# Patient Record
Sex: Female | Born: 1990 | Race: White | Hispanic: No | Marital: Married | Smoking: Never smoker
Health system: Southern US, Community
[De-identification: ages and names within clinical notes are randomized; demographics above are authoritative.]

## PROBLEM LIST (undated history)

## (undated) ENCOUNTER — Inpatient Hospital Stay: Payer: Self-pay

## (undated) DIAGNOSIS — E785 Hyperlipidemia, unspecified: Secondary | ICD-10-CM

## (undated) DIAGNOSIS — G43909 Migraine, unspecified, not intractable, without status migrainosus: Secondary | ICD-10-CM

## (undated) DIAGNOSIS — D649 Anemia, unspecified: Secondary | ICD-10-CM

## (undated) DIAGNOSIS — IMO0001 Reserved for inherently not codable concepts without codable children: Secondary | ICD-10-CM

## (undated) DIAGNOSIS — K219 Gastro-esophageal reflux disease without esophagitis: Secondary | ICD-10-CM

## (undated) HISTORY — DX: Migraine, unspecified, not intractable, without status migrainosus: G43.909

## (undated) HISTORY — DX: Anemia, unspecified: D64.9

## (undated) HISTORY — DX: Hyperlipidemia, unspecified: E78.5

---

## 2013-02-14 ENCOUNTER — Inpatient Hospital Stay: Payer: Self-pay | Admitting: Obstetrics and Gynecology

## 2013-02-15 LAB — CBC WITH DIFFERENTIAL/PLATELET
Basophil #: 0.1 10*3/uL (ref 0.0–0.1)
Basophil %: 0.7 %
Eosinophil #: 0.2 10*3/uL (ref 0.0–0.7)
Eosinophil %: 1.7 %
HCT: 33.6 % — ABNORMAL LOW (ref 35.0–47.0)
HGB: 11.3 g/dL — ABNORMAL LOW (ref 12.0–16.0)
Lymphocyte #: 2.5 10*3/uL (ref 1.0–3.6)
Lymphocyte %: 21.9 %
MCH: 28.9 pg (ref 26.0–34.0)
MCHC: 33.8 g/dL (ref 32.0–36.0)
MCV: 85 fL (ref 80–100)
Monocyte #: 0.7 x10 3/mm (ref 0.2–0.9)
Monocyte %: 6.3 %
Neutrophil #: 8.1 10*3/uL — ABNORMAL HIGH (ref 1.4–6.5)
Neutrophil %: 69.4 %
Platelet: 194 10*3/uL (ref 150–440)
RBC: 3.93 10*6/uL (ref 3.80–5.20)
RDW: 15.7 % — ABNORMAL HIGH (ref 11.5–14.5)
WBC: 11.6 10*3/uL — ABNORMAL HIGH (ref 3.6–11.0)

## 2013-02-16 LAB — HEMATOCRIT: HCT: 30.4 % — ABNORMAL LOW (ref 35.0–47.0)

## 2013-02-17 LAB — GC/CHLAMYDIA PROBE AMP

## 2013-08-09 ENCOUNTER — Emergency Department: Payer: Self-pay | Admitting: Emergency Medicine

## 2014-06-12 NOTE — Op Note (Signed)
PATIENT NAME:  Sophia Berry, Sophia Berry MR#:  [id number] DATE OF BIRTH:  15-Apr-1990  DATE OF PROCEDURE:  02/15/2013  PREOPERATIVE DIAGNOSES: 1.  A 39.0 week intrauterine pregnancy, undelivered.  2.  Fetal distress (fetal bradycardia).   POSTOPERATIVE DIAGNOSES: 1.  A 39.0 week intrauterine pregnancy, delivered.  2.  Fetal distress.  3.  Viable female 7 pounds, 4 ounces.  4.  Bandolier cord and occult cord prolapse.   OPERATIVE PROCEDURE:  Primary low cervical transverse cesarean section.   SURGEON:  Dr. Greggory KeeneFrancesco.   FIRST ASSISTANT:  None.   ANESTHESIA:  General endotracheal.   INDICATIONS:  The patient is a 24 year old white female gravida 2, para 1-0-0-1 who presented to labor and delivery in early labor.  Spontaneous two-minute deceleration was noted during the evening.  Subsequently, the patient was admitted with amniotomy and induction of labor because of the nonreassuring fetal heart rate tracing.  The patient progressed to 9 cm over 90% over 0 station with subsequent development of a fetal bradycardia in the 90s.  This persisted for greater than 15 minutes.  Cesarean section was performed in a stat manner.   FINDINGS AT SURGERY:  A viable female infant, 7 pounds 4 ounces.  There was a bandolier cord and an occult cord prolapse noted.  The infant was vigorous at birth.  Uterus, tubes and ovaries were grossly normal.   DESCRIPTION OF THE PROCEDURE:  The patient was brought to the Operating Room and in an urgent manner.  She was placed in the supine position with a right lateral hip roll in place.  She was given a Betadine and alcohol abdominal prep and drape.  Rapid sequence induction of anesthesia was performed.  Pfannenstiel incision was made in the abdomen.  The fascia was incised transversely.  Midline raphe was incised and separated and the peritoneum was entered.  Bladder flap was created with sharp dissection.  A low transverse incision was made in the uterus and this was extended in a  cephalad caudad direction.  The infant was delivered through a vertex presentation and the occult cord prolapse was identified along with a bandolier cord.  The infant was vigorous at birth.  The oropharynx was bulb-suctioned on the abdominal wall.  The infant was delivered through a standard technique.  The umbilical cord was doubly clamped and cut.  The infant was handed off to the awaiting resuscitation team.  Cord segment was obtained for arterial cord blood gas sampling.  Cord blood was also obtained.  The placenta was expressed from the uterus.  The uterus was externalized and cleared of all debris with laps.  The incision was closed in two layers with #1 chromic suture.  First layer was a running locking stitch.  Actually the second layer was not completed because of excellent hemostasis.  The uterus was placed back into the abdominopelvic cavity.  The gutters were cleared of all debris with laps.  The incision was closed in layers with 0 Maxon being used on the fascia in a continuous running manner.  The subcutaneous tissues were reapproximated using 2-0 Vicryl suture.  The skin was closed with staples.  Pressure dressing was applied.  The patient was then awakened, extubated and taken to the recovery room in satisfactory condition.   ESTIMATED BLOOD LOSS:  750 mL.   IV FLUIDS: 1 liter.   URINE OUTPUT:  Was not quantified at the time of this dictation.   Instruments, needles and sponge counts were not counted because of the stat nature  of the procedure and a KUB x-ray revealed no residual instruments or laps.  The patient did receive Ancef antibiotic prophylaxis.    ____________________________ Prentice Docker Martise Waddell, MD mad:ea D: 02/15/2013 13:12:04 ET T: 02/15/2013 23:34:52 ET JOB#: [id number]  cc: Daphine Deutscher A. Nakaya Mishkin, MD, <Dictator> [facility] Prentice Docker Daxton Nydam MD ELECTRONICALLY SIGNED 02/16/2013 13:54

## 2014-06-30 NOTE — H&P (Signed)
L&D Evaluation:  History:  HPI 22 yowf G2P1001, estimated date of confinement 02/22/2013, EGA 39.0 weeks admitted in early labor; during monitoring had a 2 minute deceleration with spontaneous resolution.   Presents with contractions   Patient's Medical History No Chronic Illness  HGSIL Pap; H/O PP Depression   Patient's Surgical History none   Medications Pre Natal Vitamins   Allergies NKDA   Social History none   Family History Non-Contributory   ROS:  ROS All systems were reviewed.  HEENT, CNS, GI, GU, Respiratory, CV, Renal and Musculoskeletal systems were found to be normal.   Exam:  Vital Signs stable   Urine Protein not completed   General no apparent distress   Mental Status clear   Heart normal sinus rhythm   Abdomen gravid, non-tender   Estimated Fetal Weight Average for gestational age   Back no CVAT   Edema 1+   Pelvic no external lesions, 4/0/-2/Vtx/bowi   FHT normal rate with no decels, 1 EPISODE OF 2 MINUTE DECELERATION, RESOLVED   Ucx irregular   Skin dry   Lymph no lymphadenopathy   Other O+/ATB-/NR/Rubella NONIMMUNE/VI/HB-/HIV-Glucola 114/GBS-/GC-/CT-   Impression:  Impression early labor, TIUP in early labor; nonreassuring FHR tracing with 2 minute spontaneous deceleration.   Plan:  Plan monitor contractions and for cervical change, fluids, AROM/Internal Monitoring/Pitocin Augmentation   Electronic Signatures: Alexxander Kurt, Prentice DockerMartin A (MD)  (Signed 27-Dec-14 09:42)  Authored: L&D Evaluation   Last Updated: 27-Dec-14 09:42 by Manfred Laspina, Prentice DockerMartin A (MD)

## 2014-06-30 NOTE — H&P (Signed)
L&D Evaluation:  History:  HPI TIUP. Check in office today 2-3 cm.   Presents with abdominal pain, back pain   Exam:  Vital Signs stable   General uncomfortable   Mental Status clear   Estimated Fetal Weight Average for gestational age   Pelvic 2-3 cm (RN Exam)   Mebranes Intact   FHT normal rate with no decels, Mild undulation initially when patient was on back supine, resolved.   Ucx irregular   Impression:  Impression early labor, NST Reactive   Plan:  Plan EFM/NST, monitor contractions and for cervical change   Electronic Signatures: DeFrancesco, Prentice DockerMartin A (MD)  (Signed 26-Dec-14 23:02)  Authored: L&D Evaluation   Last Updated: 26-Dec-14 23:02 by DeFrancesco, Prentice DockerMartin A (MD)

## 2015-01-05 DIAGNOSIS — Z3492 Encounter for supervision of normal pregnancy, unspecified, second trimester: Secondary | ICD-10-CM | POA: Insufficient documentation

## 2015-01-05 DIAGNOSIS — E669 Obesity, unspecified: Secondary | ICD-10-CM | POA: Insufficient documentation

## 2015-01-05 LAB — OB RESULTS CONSOLE RPR: RPR: NONREACTIVE

## 2015-01-05 LAB — OB RESULTS CONSOLE HIV ANTIBODY (ROUTINE TESTING): HIV: NONREACTIVE

## 2015-01-05 LAB — OB RESULTS CONSOLE HEPATITIS B SURFACE ANTIGEN: Hepatitis B Surface Ag: NEGATIVE

## 2015-01-05 LAB — OB RESULTS CONSOLE VARICELLA ZOSTER ANTIBODY, IGG: Varicella: IMMUNE

## 2015-01-05 LAB — OB RESULTS CONSOLE RUBELLA ANTIBODY, IGM: Rubella: IMMUNE

## 2015-01-05 LAB — OB RESULTS CONSOLE GC/CHLAMYDIA
Chlamydia: NEGATIVE
Gonorrhea: NEGATIVE

## 2015-02-02 ENCOUNTER — Ambulatory Visit (INDEPENDENT_AMBULATORY_CARE_PROVIDER_SITE_OTHER): Payer: Medicaid Other | Admitting: Certified Nurse Midwife

## 2015-02-02 ENCOUNTER — Encounter: Payer: Self-pay | Admitting: Certified Nurse Midwife

## 2015-02-02 VITALS — BP 92/60 | HR 76 | Ht 65.0 in | Wt 229.6 lb

## 2015-02-02 DIAGNOSIS — Z331 Pregnant state, incidental: Secondary | ICD-10-CM | POA: Diagnosis not present

## 2015-02-02 DIAGNOSIS — Z1389 Encounter for screening for other disorder: Secondary | ICD-10-CM

## 2015-02-02 DIAGNOSIS — Z3490 Encounter for supervision of normal pregnancy, unspecified, unspecified trimester: Secondary | ICD-10-CM

## 2015-02-02 DIAGNOSIS — Z369 Encounter for antenatal screening, unspecified: Secondary | ICD-10-CM

## 2015-02-02 DIAGNOSIS — O34219 Maternal care for unspecified type scar from previous cesarean delivery: Secondary | ICD-10-CM | POA: Insufficient documentation

## 2015-02-02 DIAGNOSIS — Z36 Encounter for antenatal screening of mother: Secondary | ICD-10-CM

## 2015-02-02 LAB — POCT URINALYSIS DIPSTICK
Bilirubin, UA: NEGATIVE
Blood, UA: NEGATIVE
Glucose, UA: NEGATIVE
Ketones, UA: NEGATIVE
Nitrite, UA: NEGATIVE
Protein, UA: NEGATIVE
Spec Grav, UA: 1.02
Urobilinogen, UA: NEGATIVE
pH, UA: 7

## 2015-02-02 NOTE — Patient Instructions (Signed)

## 2015-02-02 NOTE — Progress Notes (Signed)
[facility] No records are available. Have called for records. [facility] 01/11/2015. Does not take prenatal vitamins because they make her sick.

## 2015-02-02 NOTE — Progress Notes (Signed)
NEW OB HISTORY AND PHYSICAL  SUBJECTIVE:       Sophia Berry is a 24 y.o. 533P2002 female, Patient's last menstrual period was 08/30/2014 (exact date)., Estimated Date of Delivery: 06/06/15, 7033w2d, presents today for establishment of Prenatal Care. She has no unusual complaints.  [facility]  Records not availabe but have been requested.  Gynecologic History Patient's last menstrual period was 08/30/2014 (exact date). Normal Contraception: none   Obstetric History OB History  Gravida Para Term Preterm AB SAB TAB Ectopic Multiple Living  3 2 2       2     # Outcome Date GA Lbr Len/2nd Weight Sex Delivery Anes PTL Lv  3 Current           2 Term 02/15/13 5159w0d  7 lb 14 oz (3.572 kg) F CS-Unspec  N Y  1 Term 05/02/08 4777w0d  6 lb 14 oz (3.118 kg) F Vag-Spont   Y    Obstetric Comments  Fetal distress.    Past Medical History  Diagnosis Date  . Hyperlipidemia   . Anemia   . Migraines     Past Surgical History  Procedure Laterality Date  . Cesarean section      No current outpatient prescriptions on file prior to visit.   No current facility-administered medications on file prior to visit.    No Known Allergies  Social History   Social History  . Marital Status: Married    Spouse Name: N/A  . Number of Children: N/A  . Years of Education: N/A   Occupational History  . homemaker    Social History Main Topics  . Smoking status: Never Smoker   . Smokeless tobacco: Never Used  . Alcohol Use: No  . Drug Use: No  . Sexual Activity:    Partners: Male   Other Topics Concern  . Not on file   Social History Narrative    Family History  Problem Relation Age of Onset  . Asthma Mother   . Breast cancer Maternal Aunt     Great  . Cancer Maternal Grandmother     mat. great grandmother/unsure of what kind  . Diabetes Other     mat great grandmother  . Heart failure Other     mat great grandmother  . Migraines Mother     The following portions  of the patient's history were reviewed and updated as appropriate: allergies, current medications, past OB history, past medical history, past surgical history, past family history, past social history, and problem list.    OBJECTIVE: Initial Physical Exam (New OB)  GENERAL APPEARANCE: alert, well appearing, in no apparent distress, oriented to person, place and time, overweight HEAD: normocephalic, atraumatic MOUTH: mucous membranes moist, pharynx normal without lesions and dental hygiene good THYROID: no thyromegaly or masses present BREASTS: no masses noted, no significant tenderness, no palpable axillary nodes, no skin changes, not examined LUNGS: clear to auscultation, no wheezes, rales or rhonchi, symmetric air entry HEART: regular rate and rhythm, no murmurs ABDOMEN: soft, nontender, nondistended, no abnormal masses, no epigastric pain EXTREMITIES: no redness or tenderness in the calves or thighs, no edema SKIN: normal coloration and turgor, no rashes LYMPH NODES: no adenopathy palpable NEUROLOGIC: alert, oriented, normal speech, no focal findings or movement disorder noted, DTRs normal and symmetrical  PELVIC EXAM not examined, completed by previous provider.  Will complete at 36 weeks  ASSESSMENT: Normal pregnancy History of cesarean plans repeat at 39 weeks Obesity  PLAN:  Prenatal care Records release for [facility] at [facility] GTT next visit & H & H Dr Valentino Saxon appointment for ceserean scheduling & BTL

## 2015-02-21 NOTE — L&D Delivery Note (Signed)
Delivery Summary for Sophia Berry  Labor Events:   Preterm labor:   Rupture date:   Rupture time:   Rupture type:   Fluid Color: Clear  Induction:   Augmentation:   Complications:   Cervical ripening:          Delivery:   Episiotomy:   Lacerations:   Repair suture:   Repair # of packets:   Blood loss (ml):    Information for the patient's newborn:  Clare Gandyhibodaux, Girl Romie MinusDevan [[id number]][[id number]]    Delivery 05/31/2015 11:13 AM by  C-Section, Low Vertical Sex:  female Gestational Age: 4742w1d Delivery Clinician:  Hildred LaserAnika Aaralyn Kil Living?: Yes        APGARS  One minute Five minutes Ten minutes  Skin color: 0   1      Heart rate: 2   2      Grimace: 2   2      Muscle tone: 2   2      Breathing: 2   2      Totals: 8  9      Presentation/position:      Resuscitation:   Cord information: 3 vessels   Disposition of cord blood:     Blood gases sent?  Complications:   Placenta: Delivered: 05/31/2015 11:14 AM  Manual removal  Intact appearance Newborn Measurements: Weight: 8 lb 7.8 oz (3850 g)  Height: 20.08"  Head circumference:    Chest circumference:    Other providers: Midwife Registered Nurse Transition RN Neonatologist Melody N Shambley Kandice RobinsonsKelly A Yates Carolyn E Wanek John E Wimmer  Additional  information: Forceps:   Vacuum:   Breech:   Observed anomalies        See Operative Note by Dr. Hildred LaserAnika Quinntin Malter for details of C-section procedure.    Hildred LaserAnika Apryll Hinkle, MD [facility]

## 2015-03-02 ENCOUNTER — Encounter: Payer: Medicaid Other | Admitting: Certified Nurse Midwife

## 2015-03-05 ENCOUNTER — Ambulatory Visit (INDEPENDENT_AMBULATORY_CARE_PROVIDER_SITE_OTHER): Payer: Medicaid Other | Admitting: Obstetrics and Gynecology

## 2015-03-05 VITALS — BP 103/64 | HR 90 | Wt 232.0 lb

## 2015-03-05 DIAGNOSIS — Z131 Encounter for screening for diabetes mellitus: Secondary | ICD-10-CM

## 2015-03-05 DIAGNOSIS — O34219 Maternal care for unspecified type scar from previous cesarean delivery: Secondary | ICD-10-CM

## 2015-03-05 DIAGNOSIS — Z3482 Encounter for supervision of other normal pregnancy, second trimester: Secondary | ICD-10-CM

## 2015-03-05 DIAGNOSIS — Z3492 Encounter for supervision of normal pregnancy, unspecified, second trimester: Secondary | ICD-10-CM

## 2015-03-05 DIAGNOSIS — E669 Obesity, unspecified: Secondary | ICD-10-CM

## 2015-03-05 DIAGNOSIS — Z308 Encounter for other contraceptive management: Secondary | ICD-10-CM

## 2015-03-05 LAB — POCT URINALYSIS DIPSTICK
Bilirubin, UA: NEGATIVE
Blood, UA: NEGATIVE
Glucose, UA: NEGATIVE
Ketones, UA: NEGATIVE
Nitrite, UA: NEGATIVE
Protein, UA: NEGATIVE
Spec Grav, UA: 1.015
Urobilinogen, UA: NEGATIVE
pH, UA: 6.5

## 2015-03-05 NOTE — Patient Instructions (Signed)
Trial of Labor After Cesarean Delivery Information A trial of labor after cesarean delivery (TOLAC) is when a woman tries to give birth vaginally after a previous cesarean delivery. TOLAC may be a safe and appropriate option for you depending on your medical history and other risk factors. When TOLAC is successful and you are able to have a vaginal delivery, this is called a vaginal birth after cesarean delivery (VBAC).  CANDIDATES FOR TOLAC TOLAC is possible for some women who:  Have undergone one or two prior cesarean deliveries in which the incision of the uterus was horizontal (low transverse).  Are carrying twins and have had one prior low transverse incision during a cesarean delivery.  Do not have a vertical (classical) uterine scar.  Have not had a tear in the wall of their uterus (uterine rupture). TOLAC is also supported for women who meet appropriate criteria and:  Are under the age of 40 years.  Are tall and have a body mass index (BMI) of less than 30.  Have an unknown uterine scar.  Give birth in a facility equipped to handle an emergency cesarean delivery. This team should be able to handle possible complications such as a uterine rupture.  Have thorough counseling about the benefits and risks of TOLAC.  Have discussed future pregnancy plans with their health care provider.  Plan to have several more pregnancies. MOST SUCCESSFUL CANDIDATES FOR TOLAC:  Have had a successful vaginal delivery before or after their cesarean delivery.  Experience labor that begins naturally on or before the due date (40 weeks of gestation).  Do not have a very large (macrosomic) baby.   Had a prior cesarean delivery but are not currently experiencing factors that would prompt a cesarean delivery (such as a breech position).  Had only one prior cesarean delivery.  Had a prior cesarean delivery that was performed early in labor and not after full cervical dilation. TOLAC may be most  appropriate for women who meet the above guidelines and who plan to have more pregnancies. TOLAC is not recommended for home births. LEAST SUCCESSFUL CANDIDATES FOR TOLAC:  Have an induced labor with an unfavorable cervix. An unfavorable cervix is when the cervix is not dilating enough (among other factors).  Have never had a vaginal delivery.  Have had more than two cesarean deliveries.  Have a pregnancy at more than 40 weeks of gestation.  Are pregnant with a baby with a suspected weight greater than 4,000 grams (8 pounds) and who have no prior history of a vaginal delivery.  Have closely spaced pregnancies. SUGGESTED BENEFITS OF TOLAC  You may have a faster recovery time.  You may have a shorter stay in the hospital.  You may have less pain and fewer problems than with a cesarean delivery. Women who have a cesarean delivery have a higher chance of needing blood or getting a fever, an infection, or a blood clot in the legs. SUGGESTED RISKS OF TOLAC The highest risk of complications happens to women who attempt a TOLAC and fail. A failed TOLAC results in an unplanned cesarean delivery. Risks related to TOLAC or repeat cesarean deliveries include:   Blood loss.  Infection.  Blood clot.  Injury to surrounding tissues or organs.  Having to remove the uterus (hysterectomy).  Potential problems with the placenta (such as placenta previa or placenta accreta) in future pregnancies. Although very rare, the main concerns with TOLAC are:  Rupture of the uterine scar from a past cesarean delivery.  Needing an   emergency cesarean delivery.  Having a bad outcome for the baby (perinatal morbidity). FOR MORE INFORMATION American Congress of Obstetricians and Gynecologists: www.acog.org American College of Nurse-Midwives: www.midwife.org   This information is not intended to replace advice given to you by your health care provider. Make sure you discuss any questions you have with  your health care provider.   Document Released: 10/25/2010 Document Revised: 11/27/2012 Document Reviewed: 07/29/2012 Elsevier Interactive Patient Education 2016 Elsevier Inc. Cesarean Delivery Cesarean delivery is the birth of a baby through a cut (incision) in the abdomen and womb (uterus).  LET YOUR HEALTH CARE PROVIDER KNOW ABOUT:  All medicines you are taking, including vitamins, herbs, eye drops, creams, and over-the-counter medicines.  Previous problems you or members of your family have had with the use of anesthetics.  Any bleeding or blood clotting disorders you have.  Family history of blood clots or bleeding disorders.  Any history of deep vein thrombosis (DVT) or pulmonary embolism (PE).  Previous surgeries you have had.  Medical conditions you have.  Any allergies you have.  Complicationsinvolving the pregnancy. RISKS AND COMPLICATIONS  Generally, this is a safe procedure. However, as with any procedure, complications can occur. Possible complications include:  Bleeding.  Infection.  Blood clots.  Injury to surrounding organs.  Problems with anesthesia.  Injury to the baby. BEFORE THE PROCEDURE   You may be given an antacid medicine to drink. This will prevent acid contents in your stomach from going into your lungs if you vomit during the surgery.  You may be given an antibiotic medicine to prevent infection. PROCEDURE   To prevent infection of your incision:  Hair may be removed from your pubic area if it is near your incision.  The skin of your pubic area and lower abdomen will be cleaned with a germ-killing solution (antiseptic).  A tube (Foley catheter) will be placed in your bladder to drain your urine from your bladder into a bag. This keeps your bladder empty during surgery.  An IV tube will be placed in your vein.  You may be given medicine to numb the lower half of your body (regional anesthetic). If you were in labor, you may have  already had an epidural in place which can be used in both labor and cesarean delivery. You may possibly be given medicine to make you sleep (general anesthetic) though this is not as common.  Your heart rate and your baby's heart rate will be monitored.  An incision will be made in your abdomen that extends to your uterus. There are 2 basic kinds of incisions:  The horizontal (transverse) incision. Horizontal incisions are from side to side and are used for most routine cesarean deliveries.  The vertical incision. The vertical incision is from the top of the abdomen to the bottom and is less commonly used. It is often done for women who have a serious complication (extreme prematurity) or under emergency situations.  The horizontal and vertical incisions may both be used at the same time. However, this is very uncommon.  An incision is then made in your uterus to deliver the baby.  Your baby will be delivered.  Your health care provider may place the baby on your chest. It is important to keep the baby warm. Your health care provider will dry off the baby, place the baby directly on your bare skin, and cover the baby with warm, dry blankets.  Both incisions will be closed with absorbable stitches. AFTER THE PROCEDURE     incisions will be closed with absorbable stitches. AFTER THE PROCEDURE   If you were awake during the surgery, you will see your baby right away. If you were asleep, you will see your baby as soon as you are awake.  You may breastfeed your baby after surgery.  You may be able to get up and walk the same day as the surgery. If you need to stay in bed for a period of time, you will receive help to turn, cough, and take deep breaths after surgery. This helps prevent lung problems such as pneumonia.  Do not get out of bed alone the first time after surgery. You will need help getting out of bed until you are able to do this by yourself.  You may be able to shower the day after your cesarean delivery. After the bandage  (dressing) is taken off the incision site, a nurse will assist you to shower if you would like help.  You may be directed to take actions to help prevent blood clots in your legs. These may include:  Walking shortly after surgery, with someone assisting you. Moving around after surgery helps to improve blood flow.  Wearing compression stockings or using different types of devices.  Taking medicines to thin your blood (anticoagulants) if you are at high risk for DVT or PE.  Save any blood clots that you pass from your vagina. If you pass a clot while on the toilet, do not flush it. Call for the nurse. Tell the nurse if you think you are bleeding too much or passing too many clots.  You will be given medicine for pain and nausea as needed. Let your health care providers know if you are hurting. You may also be given an antibiotic to prevent an infection.  Your IV tube will be taken out when you are drinking a reasonable amount of fluids. The Foley catheter is taken out when you are up and walking.  If your blood type is Rh negative and your baby's blood type is Rh positive, you will be given a shot of anti-D immune globulin. This shot prevents you from having Rh problems with a future pregnancy. You should get the shot even if you had your tubes tied (tubal ligation).  If you are allowed to take the baby for a walk, place the baby in the bassinet and push it.   This information is not intended to replace advice given to you by your health care provider. Make sure you discuss any questions you have with your health care provider.   Document Released: 02/06/2005 Document Revised: 10/28/2014 Document Reviewed: 10/04/2011 Elsevier Interactive Patient Education [employer]2016 Elsevier Inc.

## 2015-03-05 NOTE — Progress Notes (Signed)
ROB: Patient doing well, no complaints. For glucola today.  Signed blood consent and BTL papers after contraception counseling.  Also discussed VBAC vs repeat C-section (patient was under impression that she needed a C-section to have BTL). Patient notes that she will think about it.  To discuss at further visits.  To f/u with MD at 38 weeks for pre-op if C-section still desired. For Tdap next visit.  RTC in 2 weeks. [facility]

## 2015-03-06 LAB — GLUCOSE TOLERANCE, 1 HOUR: Glucose, 1Hr PP: 143 mg/dL (ref 65–199)

## 2015-03-06 LAB — HEMOGLOBIN AND HEMATOCRIT, BLOOD
Hematocrit: 30.1 % — ABNORMAL LOW (ref 34.0–46.6)
Hemoglobin: 10.1 g/dL — ABNORMAL LOW (ref 11.1–15.9)

## 2015-03-18 ENCOUNTER — Ambulatory Visit (INDEPENDENT_AMBULATORY_CARE_PROVIDER_SITE_OTHER): Payer: Medicaid Other | Admitting: Obstetrics and Gynecology

## 2015-03-18 ENCOUNTER — Encounter: Payer: Self-pay | Admitting: Obstetrics and Gynecology

## 2015-03-18 VITALS — BP 108/72 | HR 112 | Wt 230.2 lb

## 2015-03-18 DIAGNOSIS — Z23 Encounter for immunization: Secondary | ICD-10-CM

## 2015-03-18 DIAGNOSIS — Z331 Pregnant state, incidental: Secondary | ICD-10-CM

## 2015-03-18 DIAGNOSIS — Z131 Encounter for screening for diabetes mellitus: Secondary | ICD-10-CM

## 2015-03-18 LAB — POCT URINALYSIS DIPSTICK
Bilirubin, UA: NEGATIVE
Blood, UA: NEGATIVE
Glucose, UA: NEGATIVE
Ketones, UA: NEGATIVE
Leukocytes, UA: NEGATIVE
Nitrite, UA: NEGATIVE
Protein, UA: NEGATIVE
Spec Grav, UA: 1.015
Urobilinogen, UA: 0.2
pH, UA: 6.5

## 2015-03-18 MED ORDER — TETANUS-DIPHTH-ACELL PERTUSSIS 5-2.5-18.5 LF-MCG/0.5 IM SUSP
0.5000 mL | Freq: Once | INTRAMUSCULAR | Status: AC
  Administered 2015-03-18: 0.5 mL via INTRAMUSCULAR

## 2015-03-18 NOTE — Progress Notes (Signed)
ROB- 3 hr GTT done today, pt is having some pelvic pressure and some back spasms

## 2015-03-18 NOTE — Patient Instructions (Signed)

## 2015-03-18 NOTE — Progress Notes (Signed)
ROB and 3hGTT- doing well,normal aches of pregnancy, decided she wants to proceed with repeat c/s and BTL- will schedule with Dr Valentino Saxon at 36 weeks for pre-op. Fusion Plus samples given,

## 2015-03-19 LAB — GESTATIONAL GLUCOSE TOLERANCE
Glucose, Fasting: 83 mg/dL (ref 65–94)
Glucose, GTT - 1 Hour: 179 mg/dL (ref 65–179)
Glucose, GTT - 2 Hour: 117 mg/dL (ref 65–154)
Glucose, GTT - 3 Hour: 82 mg/dL (ref 65–139)

## 2015-03-19 LAB — PLEASE NOTE

## 2015-03-23 ENCOUNTER — Observation Stay
Admission: EM | Admit: 2015-03-23 | Discharge: 2015-03-23 | Disposition: A | Discharge status: Home / Self Care | Payer: Medicaid Other | Attending: Obstetrics and Gynecology | Admitting: Obstetrics and Gynecology

## 2015-03-23 ENCOUNTER — Encounter: Payer: Self-pay | Admitting: *Deleted

## 2015-03-23 DIAGNOSIS — O368130 Decreased fetal movements, third trimester, not applicable or unspecified: Principal | ICD-10-CM | POA: Insufficient documentation

## 2015-03-23 DIAGNOSIS — Z3A29 29 weeks gestation of pregnancy: Secondary | ICD-10-CM | POA: Insufficient documentation

## 2015-03-23 DIAGNOSIS — B373 Candidiasis of vulva and vagina: Secondary | ICD-10-CM

## 2015-03-23 DIAGNOSIS — O34219 Maternal care for unspecified type scar from previous cesarean delivery: Secondary | ICD-10-CM

## 2015-03-23 DIAGNOSIS — B3731 Acute candidiasis of vulva and vagina: Secondary | ICD-10-CM

## 2015-03-23 LAB — URINALYSIS COMPLETE WITH MICROSCOPIC (ARMC ONLY)
Bilirubin Urine: NEGATIVE
Glucose, UA: NEGATIVE mg/dL
Hgb urine dipstick: NEGATIVE
Ketones, ur: NEGATIVE mg/dL
Leukocytes, UA: NEGATIVE
Nitrite: NEGATIVE
Protein, ur: NEGATIVE mg/dL
Specific Gravity, Urine: 1.027 (ref 1.005–1.030)
WBC, UA: NONE SEEN WBC/hpf (ref 0–5)
pH: 6 (ref 5.0–8.0)

## 2015-03-23 MED ORDER — FLUCONAZOLE 50 MG PO TABS
150.0000 mg | ORAL_TABLET | Freq: Once | ORAL | Status: AC
  Administered 2015-03-23: 150 mg via ORAL
  Filled 2015-03-23: qty 2
  Filled 2015-03-23: qty 3
  Filled 2015-03-23: qty 1

## 2015-03-23 NOTE — Discharge Instructions (Signed)
Discharge instructions reviewed with patient, patient verbalized understanding, signed copy, copy given.

## 2015-03-23 NOTE — OB Triage Provider Note (Signed)
L&D OB Triage Note  Sophia Berry is a 25 y.o. G3P2002 female at [redacted]w[redacted]d, EDD Estimated Date of Delivery: 06/06/15 who presented to triage for complaints of lower back/pelvic pains x 24 hours-worse this pm, increased watery discharge, and decreased fetal mov't. Denies intercourse in last couple of months.  She was evaluated by the nurses with no findings significant for preterm contractions, but was found to be NTZ+ after an SVE. I examined her with sterile speculum and found thin white d/c in vaina- negative NTZ, negative pooling, negative Fern, + yeast on wetprep, and cervix is clossed and thick.. Vital signs stable. An NST was performed and has been reviewed by Myself. She was treated with 1 dose diflucan  po.   NST INTERPRETATION: Indications: decreased fetal movement and rule out uterine contractions  Mode: External Baseline Rate (A): 130 bpm Variability: Moderate Accelerations: 15 x 15 Decelerations: None     Contraction Frequency (min): none  Impression: reactive   Plan: NST performed was reviewed and was found to be reactive. She was discharged home with bleeding/labor precautions.  Continue routine prenatal care. Follow up with OB/GYN as previously scheduled.     Melody Suzan Nailer, CNM

## 2015-03-23 NOTE — OB Triage Note (Signed)
Pt here for lower abd cramping and lower back pain, nauseous, vaginal leaking, no foul odor, no vaginal bleeding, a lot of vaginal pressure. This started last night around 2000, rested and the pain started again this a.m.

## 2015-03-23 NOTE — Progress Notes (Signed)
Nitrazine test positive. CNM notified, will be in to evaluate patient. Reactive NST, with a baseline of 130s, no contractions noted. Abd. Palpate soft.

## 2015-03-24 DIAGNOSIS — O368130 Decreased fetal movements, third trimester, not applicable or unspecified: Secondary | ICD-10-CM | POA: Diagnosis not present

## 2015-03-25 LAB — URINE CULTURE: Special Requests: NORMAL

## 2015-04-02 ENCOUNTER — Encounter: Payer: Self-pay | Admitting: Obstetrics and Gynecology

## 2015-04-02 ENCOUNTER — Ambulatory Visit (INDEPENDENT_AMBULATORY_CARE_PROVIDER_SITE_OTHER): Payer: Medicaid Other | Admitting: Obstetrics and Gynecology

## 2015-04-02 VITALS — BP 106/57 | HR 101 | Wt 230.7 lb

## 2015-04-02 DIAGNOSIS — Z331 Pregnant state, incidental: Secondary | ICD-10-CM

## 2015-04-02 LAB — POCT URINALYSIS DIPSTICK
Blood, UA: NEGATIVE
Glucose, UA: NEGATIVE
Ketones, UA: NEGATIVE
Leukocytes, UA: NEGATIVE
Nitrite, UA: NEGATIVE
Spec Grav, UA: 1.02
Urobilinogen, UA: 0.2
pH, UA: 6.5

## 2015-04-02 NOTE — Progress Notes (Signed)
ROB- reassured of no cervical change, needs to increased fluid intake.

## 2015-04-02 NOTE — Progress Notes (Signed)
ROB- pt is c/o low back pain, has been throwing up and diarrhea this week, not sure if she has had the stomach virus

## 2015-04-16 ENCOUNTER — Ambulatory Visit (INDEPENDENT_AMBULATORY_CARE_PROVIDER_SITE_OTHER): Payer: Medicaid Other | Admitting: Obstetrics and Gynecology

## 2015-04-16 ENCOUNTER — Encounter: Payer: Self-pay | Admitting: Obstetrics and Gynecology

## 2015-04-16 VITALS — BP 106/66 | HR 96 | Wt 233.7 lb

## 2015-04-16 DIAGNOSIS — Z3493 Encounter for supervision of normal pregnancy, unspecified, third trimester: Secondary | ICD-10-CM

## 2015-04-16 LAB — POCT URINALYSIS DIPSTICK
Bilirubin, UA: NEGATIVE
Blood, UA: NEGATIVE
Glucose, UA: NEGATIVE
Ketones, UA: NEGATIVE
Leukocytes, UA: NEGATIVE
Nitrite, UA: NEGATIVE
Protein, UA: NEGATIVE
Spec Grav, UA: 1.015
Urobilinogen, UA: 0.2
pH, UA: 7

## 2015-04-16 NOTE — Progress Notes (Signed)
ROB- doing well, 

## 2015-04-16 NOTE — Progress Notes (Signed)
No complaints

## 2015-04-18 ENCOUNTER — Emergency Department: Payer: Medicaid Other

## 2015-04-18 ENCOUNTER — Emergency Department
Admission: EM | Admit: 2015-04-18 | Discharge: 2015-04-18 | Disposition: A | Discharge status: Home / Self Care | Payer: Medicaid Other | Attending: Emergency Medicine | Admitting: Emergency Medicine

## 2015-04-18 DIAGNOSIS — R000 Tachycardia, unspecified: Secondary | ICD-10-CM | POA: Insufficient documentation

## 2015-04-18 DIAGNOSIS — Z79899 Other long term (current) drug therapy: Secondary | ICD-10-CM | POA: Insufficient documentation

## 2015-04-18 DIAGNOSIS — O99513 Diseases of the respiratory system complicating pregnancy, third trimester: Secondary | ICD-10-CM | POA: Insufficient documentation

## 2015-04-18 DIAGNOSIS — Z3A31 31 weeks gestation of pregnancy: Secondary | ICD-10-CM | POA: Insufficient documentation

## 2015-04-18 DIAGNOSIS — J101 Influenza due to other identified influenza virus with other respiratory manifestations: Secondary | ICD-10-CM | POA: Diagnosis not present

## 2015-04-18 DIAGNOSIS — O9989 Other specified diseases and conditions complicating pregnancy, childbirth and the puerperium: Secondary | ICD-10-CM | POA: Diagnosis not present

## 2015-04-18 LAB — COMPREHENSIVE METABOLIC PANEL
ALT: 18 U/L (ref 14–54)
AST: 19 U/L (ref 15–41)
Albumin: 3.1 g/dL — ABNORMAL LOW (ref 3.5–5.0)
Alkaline Phosphatase: 86 U/L (ref 38–126)
Anion gap: 8 (ref 5–15)
BUN: 7 mg/dL (ref 6–20)
CO2: 21 mmol/L — ABNORMAL LOW (ref 22–32)
Calcium: 8.6 mg/dL — ABNORMAL LOW (ref 8.9–10.3)
Chloride: 106 mmol/L (ref 101–111)
Creatinine, Ser: 0.52 mg/dL (ref 0.44–1.00)
GFR calc Af Amer: >60 mL/min (ref >60)
GFR calc non Af Amer: >60 mL/min (ref >60)
Glucose, Bld: 91 mg/dL (ref 65–99)
Potassium: 3.6 mmol/L (ref 3.5–5.1)
Sodium: 135 mmol/L (ref 135–145)
Total Bilirubin: 0.9 mg/dL (ref 0.3–1.2)
Total Protein: 6.3 g/dL — ABNORMAL LOW (ref 6.5–8.1)

## 2015-04-18 LAB — URINALYSIS COMPLETE WITH MICROSCOPIC (ARMC ONLY)
Bilirubin Urine: NEGATIVE
Glucose, UA: NEGATIVE mg/dL
Hgb urine dipstick: NEGATIVE
Leukocytes, UA: NEGATIVE
Nitrite: NEGATIVE
Protein, ur: NEGATIVE mg/dL
RBC / HPF: NONE SEEN RBC/hpf (ref 0–5)
Specific Gravity, Urine: 1.016 (ref 1.005–1.030)
WBC, UA: NONE SEEN WBC/hpf (ref 0–5)
pH: 6 (ref 5.0–8.0)

## 2015-04-18 LAB — CBC
HCT: 27.2 % — ABNORMAL LOW (ref 35.0–47.0)
Hemoglobin: 9.5 g/dL — ABNORMAL LOW (ref 12.0–16.0)
MCH: 29.3 pg (ref 26.0–34.0)
MCHC: 35.1 g/dL (ref 32.0–36.0)
MCV: 83.6 fL (ref 80.0–100.0)
Platelets: 182 10*3/uL (ref 150–440)
RBC: 3.25 MIL/uL — ABNORMAL LOW (ref 3.80–5.20)
RDW: 15.9 % — ABNORMAL HIGH (ref 11.5–14.5)
WBC: 10.3 10*3/uL (ref 3.6–11.0)

## 2015-04-18 LAB — RAPID INFLUENZA A&B ANTIGENS
Influenza A (ARMC): DETECTED
Influenza B (ARMC): NOT DETECTED

## 2015-04-18 MED ORDER — OSELTAMIVIR PHOSPHATE 75 MG PO CAPS: 75.0000 mg | ORAL_CAPSULE | Freq: Two times a day (BID) | ORAL | Status: AC

## 2015-04-18 MED ORDER — ACETAMINOPHEN 325 MG PO TABS
650.0000 mg | ORAL_TABLET | Freq: Once | ORAL | Status: AC
  Administered 2015-04-18: 650 mg via ORAL
  Filled 2015-04-18: qty 2

## 2015-04-18 MED ORDER — SODIUM CHLORIDE 0.9 % IV SOLN
1000.0000 mL | Freq: Once | INTRAVENOUS | Status: AC
  Administered 2015-04-18: 1000 mL via INTRAVENOUS

## 2015-04-18 NOTE — ED Notes (Addendum)
Pt reports waking up with a cough on Friday unknown fever reports chills and sweats. States pain all over. Pt also [redacted] weeks pregnant. No vomiting no diarrhea.

## 2015-04-18 NOTE — Discharge Instructions (Signed)

## 2015-04-18 NOTE — ED Provider Notes (Signed)
[facility] Emergency Department Provider Note  ____________________________________________    I have reviewed the triage vital signs and the nursing notes.   HISTORY  Chief Complaint Cough   HPI Sophia Berry is a 25 y.o. female who presents with complaints of severe cough, myalgias and mild shortness of breath for 2 days. Husband reportedly had similar symptoms as does daughter. Patient is [redacted] weeks pregnant. She denies recent travel. No calf pain or swelling. She reports she recently recovered from a viral GI bug and then developed this cough 2 days ago. She complains of mild short of breath when ambulating. He is here with her daughter who is also being seen     Past Medical History  Diagnosis Date  . Hyperlipidemia   . Anemia   . Migraines     Patient Active Problem List   Diagnosis Date Noted  . Labor and delivery, indication for care 03/23/2015  . Yeast infection of the vagina 03/23/2015  . Previous cesarean delivery, antepartum 02/02/2015  . Adiposity 01/05/2015  . Encounter for supervision of normal pregnancy in second trimester 01/05/2015    Past Surgical History  Procedure Laterality Date  . Cesarean section      Current Outpatient Rx  Name  Route  Sig  Dispense  Refill  . ferrous sulfate 325 (65 FE) MG tablet   Oral   Take 325 mg by mouth daily with breakfast.         . Prenatal Vit-Fe Fumarate-FA (PRENATAL MULTIVITAMIN) TABS tablet   Oral   Take 1 tablet by mouth daily at 12 noon. Reported on 03/23/2015           Allergies Review of patient's allergies indicates no known allergies.  Family History  Problem Relation Age of Onset  . Asthma Mother   . Breast cancer Maternal Aunt     Great  . Cancer Maternal Grandmother     mat. great grandmother/unsure of what kind  . Diabetes Other     mat great grandmother  . Heart failure Other     mat great grandmother  . Migraines Mother     Social History Social  History  Substance Use Topics  . Smoking status: Never Smoker   . Smokeless tobacco: Never Used  . Alcohol Use: No    Review of Systems  Constitutional: Subjective fevers Eyes: Negative for visual changes. ENT: Mild sore throat Cardiovascular: Negative for chest pain. Respiratory: Positive for mild short of breath and cough Gastrointestinal: Negative for abdominal pain, vomiting and diarrhea. Genitourinary: Negative for dysuria. Musculoskeletal: Negative for back pain. Skin: Negative for rash. Neurological: Negative for headaches or neck pain Psychiatric: No anxiety    ____________________________________________   PHYSICAL EXAM:  VITAL SIGNS: ED Triage Vitals  Enc Vitals Group     BP 04/18/15 0900 104/53 mmHg     Pulse Rate 04/18/15 0900 128     Resp 04/18/15 0900 20     Temp 04/18/15 0900 98.9 F (37.2 C)     Temp Source 04/18/15 0900 Oral     SpO2 04/18/15 0900 96 %     Weight 04/18/15 0900 233 lb (105.688 kg)     Height 04/18/15 0900  (1.651 m)     Head Cir --      Peak Flow --      Pain Score 04/18/15 0902 10     Pain Loc --      Pain Edu? --      Excl.  in GC? --      Constitutional: Alert and oriented. No acute distress Eyes: Conjunctivae are normal.  ENT   Head: Normocephalic and atraumatic.   Mouth/Throat: Mucous membranes are moist. Cardiovascular: Tachycardia, regular rhythm. Normal and symmetric distal pulses are present in all extremities. No murmurs, rubs, or gallops. Respiratory: Normal respiratory effort without tachypnea nor retractions. Breath sounds are clear and equal bilaterally.  Gastrointestinal: Gravid appearance Soft and non-tender in all quadrants.  There is no CVA tenderness. Genitourinary: deferred Musculoskeletal: Nontender with normal range of motion in all extremities. No lower extremity tenderness nor edema. Neurologic:  Normal speech and language. No gross focal neurologic deficits are appreciated. Skin:  Skin is  warm, dry and intact. No rash noted. Psychiatric: Mood and affect are normal. Patient exhibits appropriate insight and judgment.  ____________________________________________    LABS (pertinent positives/negatives)  Labs Reviewed  RAPID INFLUENZA A&B ANTIGENS ([facility] ONLY)  CBC  COMPREHENSIVE METABOLIC PANEL  URINALYSIS COMPLETEWITH MICROSCOPIC ([facility] ONLY)    ____________________________________________   EKG  ED ECG REPORT I, Jene Every, the attending physician, personally viewed and interpreted this ECG.  Date: 04/18/2015 EKG Time: 10:09 AM Rate: 115 Rhythm: Sinus tachycardia QRS Axis: normal Intervals: normal ST/T Wave abnormalities: normal Conduction Disturbances: none Narrative Interpretation: unremarkable   ____________________________________________    RADIOLOGY I have personally reviewed any xrays that were ordered on this patient: Chest x-ray unremarkable  ____________________________________________   PROCEDURES  Procedure(s) performed: none  Critical Care performed: none  ____________________________________________   INITIAL IMPRESSION / ASSESSMENT AND PLAN / ED COURSE  Pertinent labs & imaging results that were available during my care of the patient were reviewed by me and considered in my medical decision making (see chart for details).  Patient presents with symptoms of likely viral upper respiratory infection however she is markedly tachycardic with a productive cough. We will take an x-ray (I did discuss with her that we would shield the abdomen and she approves) . History of present illness is not consistent with PE as everyone in her family has similar symptoms. We will give IV fluids and check labs as well and reevaluate  Discussed with Dr. Valentino Saxon her OB who agrees with fluids and reassessment.  ----------------------------------------- 12:15 PM on 04/18/2015 -----------------------------------------  Discussed case with Dr.  Valentino Saxon again. HR has improved to 100-105. Dr. Valentino Saxon is comfortable with this and recommends discharge with follow up in her office. Will rx tamiflu  ____________________________________________   FINAL CLINICAL IMPRESSION(S) / ED DIAGNOSES  Final diagnoses:  Influenza A     Jene Every, MD 04/18/15 [phone]

## 2015-04-22 ENCOUNTER — Encounter: Payer: Medicaid Other | Admitting: Obstetrics and Gynecology

## 2015-04-29 ENCOUNTER — Ambulatory Visit (INDEPENDENT_AMBULATORY_CARE_PROVIDER_SITE_OTHER): Payer: Medicaid Other | Admitting: Obstetrics and Gynecology

## 2015-04-29 ENCOUNTER — Encounter: Payer: Self-pay | Admitting: Obstetrics and Gynecology

## 2015-04-29 ENCOUNTER — Encounter: Payer: Medicaid Other | Admitting: Obstetrics and Gynecology

## 2015-04-29 VITALS — BP 108/68 | HR 93 | Wt 230.6 lb

## 2015-04-29 DIAGNOSIS — Z331 Pregnant state, incidental: Secondary | ICD-10-CM

## 2015-04-29 LAB — POCT URINALYSIS DIPSTICK
Bilirubin, UA: NEGATIVE
Blood, UA: NEGATIVE
Glucose, UA: NEGATIVE
Ketones, UA: NEGATIVE
Leukocytes, UA: NEGATIVE
Nitrite, UA: NEGATIVE
Protein, UA: NEGATIVE
Spec Grav, UA: 1.01
Urobilinogen, UA: 0.2
pH, UA: 6.5

## 2015-04-29 NOTE — Progress Notes (Signed)
ROB- pt is having lots of pelvic pressure, low back pain Patient and daughter have been fighting "flu syndrome". Daughter appears to have bilateral rosy cheeks consistent with slight skin syndrome-fifth disease. We'll draw B19 parvovirus titer.

## 2015-04-30 LAB — PARVOVIRUS B19 ANTIBODY, IGG AND IGM
Parvovirus B19 IgG: 2.6 index — ABNORMAL HIGH (ref 0.0–0.8)
Parvovirus B19 IgM: 0.3 index (ref 0.0–0.8)

## 2015-05-04 ENCOUNTER — Inpatient Hospital Stay
Admission: EM | Admit: 2015-05-04 | Discharge: 2015-05-04 | Disposition: A | Discharge status: Home / Self Care | Payer: Medicaid Other | Attending: Obstetrics and Gynecology | Admitting: Obstetrics and Gynecology

## 2015-05-04 DIAGNOSIS — R42 Dizziness and giddiness: Secondary | ICD-10-CM

## 2015-05-04 NOTE — Discharge Instructions (Signed)
Eat small, protein rich meals every 2 hours every day. Call your provider with any further questions or concerns. Keep any scheduled appointments.

## 2015-05-04 NOTE — OB Triage Note (Signed)
Ms. Sophia Berry here with c/o "feeling lightheaded around noon today", lasted a few minutes and has happened off and on this afternoon. Also reports going to bathroom around 2 PM and noticing some clear mucous discharge. Denies bleeding, LOF, reports positive fetal movement. States she has eaten some chips and a small chicken tv dinner today.

## 2015-05-04 NOTE — OB Triage Provider Note (Signed)
L&D OB Triage Note  Sophia Berry is a 25 y.o. 483P2002 female at 471w2d, EDD Estimated Date of Delivery: 06/06/15 who presented to triage for complaints of dizziness today around noon, then mucus discharge a few hours later. Feels fine now.  She was evaluated by the nurses with no significant findings/findings significant for leaking fluid or prenatal concerns. Vital signs stable. An NST was performed and has been reviewed by me.   NST INTERPRETATION: Indications: rule out uterine contractions  Mode: External Baseline Rate (A): 145 bpm              Impression: reactive   Plan: NST performed was reviewed and was found to be reactive. She was discharged home with bleeding/labor precautions.  Continue routine prenatal care. To increase meals high in protein.Follow up with OB/GYN as previously scheduled.     Sophia Berry, CNM

## 2015-05-13 ENCOUNTER — Ambulatory Visit (INDEPENDENT_AMBULATORY_CARE_PROVIDER_SITE_OTHER): Payer: Medicaid Other | Admitting: Obstetrics and Gynecology

## 2015-05-13 VITALS — BP 110/69 | HR 116 | Wt 235.5 lb

## 2015-05-13 DIAGNOSIS — Z36 Encounter for antenatal screening of mother: Secondary | ICD-10-CM

## 2015-05-13 DIAGNOSIS — Z3483 Encounter for supervision of other normal pregnancy, third trimester: Secondary | ICD-10-CM

## 2015-05-13 DIAGNOSIS — Z113 Encounter for screening for infections with a predominantly sexual mode of transmission: Secondary | ICD-10-CM

## 2015-05-13 DIAGNOSIS — O34219 Maternal care for unspecified type scar from previous cesarean delivery: Secondary | ICD-10-CM

## 2015-05-13 DIAGNOSIS — Z369 Encounter for antenatal screening, unspecified: Secondary | ICD-10-CM

## 2015-05-13 DIAGNOSIS — Z3493 Encounter for supervision of normal pregnancy, unspecified, third trimester: Secondary | ICD-10-CM

## 2015-05-13 DIAGNOSIS — E669 Obesity, unspecified: Secondary | ICD-10-CM

## 2015-05-13 LAB — POCT URINALYSIS DIPSTICK
Bilirubin, UA: NEGATIVE
Blood, UA: NEGATIVE
Glucose, UA: NEGATIVE
Ketones, UA: NEGATIVE
Leukocytes, UA: NEGATIVE
Nitrite, UA: NEGATIVE
Spec Grav, UA: 1.02
Urobilinogen, UA: NEGATIVE
pH, UA: 6.5

## 2015-05-13 LAB — OB RESULTS CONSOLE GBS: GBS: NEGATIVE

## 2015-05-13 NOTE — Progress Notes (Signed)
ROB: Referral from [facility] for surgery consultation for repeat C-section. Patient complains of bilateral feet and ankle swelling, noting at times that it is getting worse and painful to walk.  Is increasing hydration, decreasing salt intake, and elevating legs.  Discussed use of compression hose.  Pre-op done today for patient, scheduled for repeat C-section with BTL on 05/31/2015.  BTL papers previously signed.  Discussed labor precautions and fetal kick counts.  RTC in 1 week with [facility].

## 2015-05-15 LAB — GC/CHLAMYDIA PROBE AMP
Chlamydia trachomatis, NAA: NEGATIVE
Neisseria gonorrhoeae by PCR: NEGATIVE

## 2015-05-17 LAB — CULTURE, BETA STREP (GROUP B ONLY): Strep Gp B Culture: NEGATIVE

## 2015-05-19 ENCOUNTER — Ambulatory Visit (INDEPENDENT_AMBULATORY_CARE_PROVIDER_SITE_OTHER): Payer: Medicaid Other | Admitting: Obstetrics and Gynecology

## 2015-05-19 ENCOUNTER — Encounter: Payer: Self-pay | Admitting: Obstetrics and Gynecology

## 2015-05-19 VITALS — BP 108/72 | HR 103 | Wt 234.3 lb

## 2015-05-19 DIAGNOSIS — Z331 Pregnant state, incidental: Secondary | ICD-10-CM

## 2015-05-19 LAB — POCT URINALYSIS DIPSTICK
Bilirubin, UA: NEGATIVE
Blood, UA: NEGATIVE
Glucose, UA: NEGATIVE
Ketones, UA: NEGATIVE
Leukocytes, UA: NEGATIVE
Nitrite, UA: NEGATIVE
Protein, UA: NEGATIVE
Spec Grav, UA: 1.015
Urobilinogen, UA: 0.2
pH, UA: 6.5

## 2015-05-19 NOTE — Progress Notes (Signed)
ROB- pt c/o low back pain, c/o "swelling in her legs", some menstrual cramping

## 2015-05-19 NOTE — Progress Notes (Signed)
ROB-no changes since last visit, reiterated negative GBS

## 2015-05-26 ENCOUNTER — Encounter: Payer: Medicaid Other | Admitting: Obstetrics and Gynecology

## 2015-05-26 ENCOUNTER — Ambulatory Visit (INDEPENDENT_AMBULATORY_CARE_PROVIDER_SITE_OTHER): Payer: Medicaid Other | Admitting: Obstetrics and Gynecology

## 2015-05-26 ENCOUNTER — Encounter: Payer: Self-pay | Admitting: Obstetrics and Gynecology

## 2015-05-26 VITALS — BP 102/57 | HR 92 | Wt 239.8 lb

## 2015-05-26 DIAGNOSIS — Z331 Pregnant state, incidental: Secondary | ICD-10-CM

## 2015-05-26 LAB — POCT URINALYSIS DIPSTICK
Bilirubin, UA: NEGATIVE
Blood, UA: NEGATIVE
Glucose, UA: NEGATIVE
Ketones, UA: NEGATIVE
Nitrite, UA: NEGATIVE
Protein, UA: NEGATIVE
Spec Grav, UA: 1.015
Urobilinogen, UA: 0.2
pH, UA: 6.5

## 2015-05-26 NOTE — Progress Notes (Signed)
ROB- pt is c/o lots of pelvic pressure 

## 2015-05-26 NOTE — Progress Notes (Signed)
ROB- repeat c/s and BTL scheduled for 0730a on 4/10, patient is aware- [facility] as we never received them

## 2015-05-27 LAB — URINE CULTURE

## 2015-05-28 ENCOUNTER — Encounter
Admission: RE | Admit: 2015-05-28 | Discharge: 2015-05-28 | Disposition: A | Discharge status: Home / Self Care | Payer: Medicaid Other | Source: Ambulatory Visit | Attending: Obstetrics and Gynecology | Admitting: Obstetrics and Gynecology

## 2015-05-28 DIAGNOSIS — Z01812 Encounter for preprocedural laboratory examination: Secondary | ICD-10-CM | POA: Diagnosis present

## 2015-05-28 HISTORY — DX: Reserved for inherently not codable concepts without codable children: IMO0001

## 2015-05-28 HISTORY — DX: Gastro-esophageal reflux disease without esophagitis: K21.9

## 2015-05-28 LAB — CBC
HCT: 28.3 % — ABNORMAL LOW (ref 35.0–47.0)
Hemoglobin: 9.6 g/dL — ABNORMAL LOW (ref 12.0–16.0)
MCH: 28.1 pg (ref 26.0–34.0)
MCHC: 33.8 g/dL (ref 32.0–36.0)
MCV: 83.2 fL (ref 80.0–100.0)
Platelets: 220 10*3/uL (ref 150–440)
RBC: 3.41 MIL/uL — ABNORMAL LOW (ref 3.80–5.20)
RDW: 16.6 % — ABNORMAL HIGH (ref 11.5–14.5)
WBC: 8.2 10*3/uL (ref 3.6–11.0)

## 2015-05-28 LAB — RAPID HIV SCREEN (HIV 1/2 AB+AG)
HIV 1/2 Antibodies: NONREACTIVE
HIV-1 P24 Antigen - HIV24: NONREACTIVE

## 2015-05-28 LAB — TYPE AND SCREEN
ABO/RH(D): O POS
Antibody Screen: NEGATIVE
Extend sample reason: UNDETERMINED

## 2015-05-28 LAB — ABO/RH: ABO/RH(D): O POS

## 2015-05-28 NOTE — Patient Instructions (Signed)
  Your procedure is scheduled on: 7:40AM on 05/31/15 Report to 3rd floor labor and delivery medical mall.   Remember: Instructions that are not followed completely may result in serious medical risk, up to and including death, or upon the discretion of your surgeon and anesthesiologist your surgery may need to be rescheduled.    _x___ 1. Do not eat food or drink liquids after midnight. No gum chewing or hard candies.     ____ 2. No Alcohol for 24 hours before or after surgery.   ____ 3. Bring all medications with you on the day of surgery if instructed.    __x__ 4. Notify your doctor if there is any change in your medical condition     (cold, fever, infections).     Do not wear jewelry, make-up, hairpins, clips or nail polish.  Do not wear lotions, powders, or perfumes. You may wear deodorant.  Do not shave 48 hours prior to surgery. Men may shave face and neck.  Do not bring valuables to the hospital.    [facility] is not responsible for any belongings or valuables.               Contacts, dentures or bridgework may not be worn into surgery.  Leave your suitcase in the car. After surgery it may be brought to your room.  For patients admitted to the hospital, discharge time is determined by your                treatment team.   Patients discharged the day of surgery will not be allowed to drive home.   Please read over the following fact sheets that you were given:      ____ Take these medicines the morning of surgery with A SIP OF WATER:    1. None  2.   3.   4.  5.  6.  ____ Fleet Enema (as directed)   _x__ Use CHG wipes as directed  ____ Use inhalers on the day of surgery  ____ Stop metformin 2 days prior to surgery    ____ Take 1/2 of usual insulin dose the night before surgery and none on the morning of surgery.   ____ Stop Coumadin/Plavix/aspirin on   ____ Stop Anti-inflammatories on    ____ Stop supplements until after surgery.    ____ Bring C-Pap to the  hospital.

## 2015-05-29 LAB — RPR: RPR Ser Ql: NONREACTIVE

## 2015-05-31 ENCOUNTER — Inpatient Hospital Stay: Payer: Medicaid Other | Admitting: Anesthesiology

## 2015-05-31 ENCOUNTER — Inpatient Hospital Stay
Admission: RE | Admit: 2015-05-31 | Discharge: 2015-06-02 | DRG: 766 | Disposition: A | Discharge status: Home / Self Care | Payer: Medicaid Other | Source: Ambulatory Visit | Attending: Obstetrics and Gynecology | Admitting: Obstetrics and Gynecology

## 2015-05-31 ENCOUNTER — Encounter
Admission: RE | Disposition: A | Discharge status: Home / Self Care | Payer: Self-pay | Source: Ambulatory Visit | Attending: Obstetrics and Gynecology

## 2015-05-31 DIAGNOSIS — O9962 Diseases of the digestive system complicating childbirth: Secondary | ICD-10-CM | POA: Diagnosis present

## 2015-05-31 DIAGNOSIS — E785 Hyperlipidemia, unspecified: Secondary | ICD-10-CM | POA: Diagnosis present

## 2015-05-31 DIAGNOSIS — Z3493 Encounter for supervision of normal pregnancy, unspecified, third trimester: Secondary | ICD-10-CM | POA: Diagnosis not present

## 2015-05-31 DIAGNOSIS — O34211 Maternal care for low transverse scar from previous cesarean delivery: Principal | ICD-10-CM | POA: Diagnosis present

## 2015-05-31 DIAGNOSIS — K219 Gastro-esophageal reflux disease without esophagitis: Secondary | ICD-10-CM | POA: Diagnosis present

## 2015-05-31 DIAGNOSIS — Z3A39 39 weeks gestation of pregnancy: Secondary | ICD-10-CM | POA: Diagnosis not present

## 2015-05-31 DIAGNOSIS — Z98891 History of uterine scar from previous surgery: Secondary | ICD-10-CM

## 2015-05-31 DIAGNOSIS — R42 Dizziness and giddiness: Secondary | ICD-10-CM

## 2015-05-31 DIAGNOSIS — G43909 Migraine, unspecified, not intractable, without status migrainosus: Secondary | ICD-10-CM | POA: Diagnosis present

## 2015-05-31 DIAGNOSIS — D509 Iron deficiency anemia, unspecified: Secondary | ICD-10-CM | POA: Diagnosis present

## 2015-05-31 DIAGNOSIS — Z302 Encounter for sterilization: Secondary | ICD-10-CM | POA: Diagnosis not present

## 2015-05-31 DIAGNOSIS — O9902 Anemia complicating childbirth: Secondary | ICD-10-CM | POA: Diagnosis present

## 2015-05-31 DIAGNOSIS — E669 Obesity, unspecified: Secondary | ICD-10-CM | POA: Diagnosis present

## 2015-05-31 DIAGNOSIS — B373 Candidiasis of vulva and vagina: Secondary | ICD-10-CM

## 2015-05-31 DIAGNOSIS — B3731 Acute candidiasis of vulva and vagina: Secondary | ICD-10-CM

## 2015-05-31 DIAGNOSIS — O34219 Maternal care for unspecified type scar from previous cesarean delivery: Secondary | ICD-10-CM | POA: Diagnosis present

## 2015-05-31 SURGERY — Surgical Case
Anesthesia: Spinal

## 2015-05-31 MED ORDER — LIDOCAINE 5 % EX PTCH
1.0000 | MEDICATED_PATCH | CUTANEOUS | Status: DC
  Administered 2015-06-01 – 2015-06-02 (×2): 1 via TRANSDERMAL
  Filled 2015-05-31 (×2): qty 1

## 2015-05-31 MED ORDER — MAGNESIUM HYDROXIDE 400 MG/5ML PO SUSP: 30.0000 mL | ORAL | Status: DC | PRN

## 2015-05-31 MED ORDER — CEFAZOLIN SODIUM-DEXTROSE 2-4 GM/100ML-% IV SOLN
2.0000 g | INTRAVENOUS | Status: AC
  Administered 2015-05-31: 2 g via INTRAVENOUS
  Filled 2015-05-31: qty 100

## 2015-05-31 MED ORDER — SENNOSIDES-DOCUSATE SODIUM 8.6-50 MG PO TABS
2.0000 | ORAL_TABLET | ORAL | Status: DC
  Administered 2015-06-01 – 2015-06-02 (×2): 2 via ORAL
  Filled 2015-05-31 (×2): qty 2

## 2015-05-31 MED ORDER — 0.9 % SODIUM CHLORIDE (POUR BTL) OPTIME
TOPICAL | Status: DC | PRN
  Administered 2015-05-31: 1000 mL

## 2015-05-31 MED ORDER — FENTANYL CITRATE (PF) 100 MCG/2ML IJ SOLN
INTRAMUSCULAR | Status: DC | PRN
Start: 2015-05-31 — End: 2015-05-31
  Administered 2015-05-31: 15 ug via INTRATHECAL

## 2015-05-31 MED ORDER — SODIUM CHLORIDE 0.9% FLUSH: 3.0000 mL | INTRAVENOUS | Status: DC | PRN

## 2015-05-31 MED ORDER — ACETAMINOPHEN 325 MG PO TABS: 650.0000 mg | ORAL_TABLET | ORAL | Status: DC | PRN

## 2015-05-31 MED ORDER — DIBUCAINE 1 % RE OINT: 1.0000 "application " | TOPICAL_OINTMENT | RECTAL | Status: DC | PRN

## 2015-05-31 MED ORDER — MORPHINE SULFATE (PF) 0.5 MG/ML IJ SOLN
INTRAMUSCULAR | Status: DC | PRN
  Administered 2015-05-31: .1 mg via INTRATHECAL

## 2015-05-31 MED ORDER — KETOROLAC TROMETHAMINE 30 MG/ML IJ SOLN
30.0000 mg | Freq: Four times a day (QID) | INTRAMUSCULAR | Status: AC | PRN
  Administered 2015-05-31 – 2015-06-01 (×3): 30 mg via INTRAVENOUS
  Filled 2015-05-31 (×2): qty 1

## 2015-05-31 MED ORDER — MENTHOL 3 MG MT LOZG
1.0000 | LOZENGE | OROMUCOSAL | Status: DC | PRN
  Filled 2015-05-31: qty 9

## 2015-05-31 MED ORDER — ONDANSETRON HCL 4 MG/2ML IJ SOLN: 4.0000 mg | Freq: Three times a day (TID) | INTRAMUSCULAR | Status: DC | PRN

## 2015-05-31 MED ORDER — NALBUPHINE HCL 10 MG/ML IJ SOLN: 5.0000 mg | Freq: Once | INTRAMUSCULAR | Status: DC | PRN

## 2015-05-31 MED ORDER — IBUPROFEN 800 MG PO TABS
800.0000 mg | ORAL_TABLET | Freq: Four times a day (QID) | ORAL | Status: DC
  Administered 2015-06-01 – 2015-06-02 (×4): 800 mg via ORAL
  Filled 2015-05-31 (×4): qty 1

## 2015-05-31 MED ORDER — OXYCODONE-ACETAMINOPHEN 5-325 MG PO TABS
2.0000 | ORAL_TABLET | ORAL | Status: DC | PRN
  Administered 2015-06-01 (×3): 2 via ORAL
  Filled 2015-05-31 (×3): qty 2

## 2015-05-31 MED ORDER — OXYTOCIN 40 UNITS IN LACTATED RINGERS INFUSION - SIMPLE MED
INTRAVENOUS | Status: AC
  Administered 2015-05-31: 500 mL via INTRAVENOUS
  Filled 2015-05-31: qty 1000

## 2015-05-31 MED ORDER — DIPHENHYDRAMINE HCL 50 MG/ML IJ SOLN
12.5000 mg | INTRAMUSCULAR | Status: DC | PRN
Start: 2015-05-31 — End: 2015-06-02

## 2015-05-31 MED ORDER — NALBUPHINE HCL 10 MG/ML IJ SOLN: 5.0000 mg | INTRAMUSCULAR | Status: DC | PRN

## 2015-05-31 MED ORDER — DIPHENHYDRAMINE HCL 25 MG PO CAPS: 25.0000 mg | ORAL_CAPSULE | ORAL | Status: DC | PRN

## 2015-05-31 MED ORDER — ACETAMINOPHEN 500 MG PO TABS: 1000.0000 mg | ORAL_TABLET | Freq: Four times a day (QID) | ORAL | Status: AC

## 2015-05-31 MED ORDER — DEXTROSE 5 % IV SOLN
1.0000 ug/kg/h | INTRAVENOUS | Status: DC | PRN
  Filled 2015-05-31: qty 2

## 2015-05-31 MED ORDER — PRENATAL MULTIVITAMIN CH
1.0000 | ORAL_TABLET | Freq: Every day | ORAL | Status: DC
Start: 2015-06-01 — End: 2015-06-02
  Administered 2015-06-01: 1 via ORAL
  Filled 2015-05-31: qty 1

## 2015-05-31 MED ORDER — ONDANSETRON HCL 4 MG/2ML IJ SOLN
INTRAMUSCULAR | Status: DC | PRN
  Administered 2015-05-31: 4 mg via INTRAVENOUS

## 2015-05-31 MED ORDER — LACTATED RINGERS IV SOLN
2.5000 [IU]/h | INTRAVENOUS | Status: AC
  Filled 2015-05-31: qty 4

## 2015-05-31 MED ORDER — DIPHENHYDRAMINE HCL 25 MG PO CAPS: 25.0000 mg | ORAL_CAPSULE | Freq: Four times a day (QID) | ORAL | Status: DC | PRN

## 2015-05-31 MED ORDER — SIMETHICONE 80 MG PO CHEW
80.0000 mg | CHEWABLE_TABLET | ORAL | Status: DC | PRN
  Administered 2015-06-02: 80 mg via ORAL

## 2015-05-31 MED ORDER — FERROUS SULFATE 325 (65 FE) MG PO TABS
325.0000 mg | ORAL_TABLET | Freq: Three times a day (TID) | ORAL | Status: DC
  Administered 2015-06-01 – 2015-06-02 (×4): 325 mg via ORAL
  Filled 2015-05-31 (×4): qty 1

## 2015-05-31 MED ORDER — LACTATED RINGERS IV SOLN
INTRAVENOUS | Status: DC
  Administered 2015-05-31 – 2015-06-01 (×2): via INTRAVENOUS

## 2015-05-31 MED ORDER — ZOLPIDEM TARTRATE 5 MG PO TABS: 5.0000 mg | ORAL_TABLET | Freq: Every evening | ORAL | Status: DC | PRN

## 2015-05-31 MED ORDER — WITCH HAZEL-GLYCERIN EX PADS: 1.0000 "application " | MEDICATED_PAD | CUTANEOUS | Status: DC | PRN

## 2015-05-31 MED ORDER — OXYCODONE-ACETAMINOPHEN 5-325 MG PO TABS: 1.0000 | ORAL_TABLET | ORAL | Status: DC | PRN

## 2015-05-31 MED ORDER — PHENYLEPHRINE HCL 10 MG/ML IJ SOLN
INTRAMUSCULAR | Status: DC | PRN
  Administered 2015-05-31 (×6): 100 ug via INTRAVENOUS

## 2015-05-31 MED ORDER — NALOXONE HCL 0.4 MG/ML IJ SOLN: 0.4000 mg | INTRAMUSCULAR | Status: DC | PRN

## 2015-05-31 MED ORDER — BUPIVACAINE IN DEXTROSE 0.75-8.25 % IT SOLN
INTRATHECAL | Status: DC | PRN
  Administered 2015-05-31: 1.6 mL via INTRATHECAL

## 2015-05-31 MED ORDER — LIDOCAINE 5 % EX PTCH
1.0000 | MEDICATED_PATCH | CUTANEOUS | Status: DC
  Filled 2015-05-31: qty 1

## 2015-05-31 MED ORDER — LACTATED RINGERS IV SOLN
INTRAVENOUS | Status: DC
  Administered 2015-05-31: 08:00:00 via INTRAVENOUS

## 2015-05-31 MED ORDER — LACTATED RINGERS IV SOLN: INTRAVENOUS | Status: DC

## 2015-05-31 MED ORDER — SIMETHICONE 80 MG PO CHEW
80.0000 mg | CHEWABLE_TABLET | ORAL | Status: DC
  Administered 2015-06-01: 80 mg via ORAL
  Filled 2015-05-31 (×2): qty 1

## 2015-05-31 MED ORDER — LANOLIN HYDROUS EX OINT: 1.0000 "application " | TOPICAL_OINTMENT | CUTANEOUS | Status: DC | PRN

## 2015-05-31 MED ORDER — LACTATED RINGERS IV SOLN
INTRAVENOUS | Status: DC
  Administered 2015-05-31: 09:00:00 via INTRAVENOUS

## 2015-05-31 MED ORDER — KETOROLAC TROMETHAMINE 30 MG/ML IJ SOLN
30.0000 mg | Freq: Four times a day (QID) | INTRAMUSCULAR | Status: AC | PRN
  Filled 2015-05-31: qty 1

## 2015-05-31 MED ORDER — FENTANYL CITRATE (PF) 100 MCG/2ML IJ SOLN
25.0000 ug | INTRAMUSCULAR | Status: DC | PRN
  Administered 2015-05-31 (×4): 50 ug via INTRAVENOUS
  Filled 2015-05-31: qty 2

## 2015-05-31 SURGICAL SUPPLY — 27 items
BAG COUNTER SPONGE EZ (MISCELLANEOUS) ×2 IMPLANT
CANISTER SUCT 3000ML (MISCELLANEOUS) ×3 IMPLANT
CHLORAPREP W/TINT 26ML (MISCELLANEOUS) ×6 IMPLANT
CLOSURE WOUND 1/2 X4 (GAUZE/BANDAGES/DRESSINGS) ×1
COUNTER SPONGE BAG EZ (MISCELLANEOUS) ×1
DRSG TELFA 3X8 NADH (GAUZE/BANDAGES/DRESSINGS) ×3 IMPLANT
ELECT CAUTERY BLADE 6.4 (BLADE) ×3 IMPLANT
ELECT REM PT RETURN 9FT ADLT (ELECTROSURGICAL) ×3
ELECTRODE REM PT RTRN 9FT ADLT (ELECTROSURGICAL) ×1 IMPLANT
GAUZE SPONGE 4X4 12PLY STRL (GAUZE/BANDAGES/DRESSINGS) ×3 IMPLANT
GLOVE BIO SURGEON STRL SZ 6 (GLOVE) ×3 IMPLANT
GLOVE BIOGEL PI IND STRL 6.5 (GLOVE) ×1 IMPLANT
GLOVE BIOGEL PI INDICATOR 6.5 (GLOVE) ×2
GOWN STRL REUS W/ TWL LRG LVL3 (GOWN DISPOSABLE) ×2 IMPLANT
GOWN STRL REUS W/TWL LRG LVL3 (GOWN DISPOSABLE) ×4
KIT RM TURNOVER STRD PROC AR (KITS) ×3 IMPLANT
NS IRRIG 1000ML POUR BTL (IV SOLUTION) ×3 IMPLANT
PACK C SECTION AR (MISCELLANEOUS) ×3 IMPLANT
PAD OB MATERNITY 4.3X12.25 (PERSONAL CARE ITEMS) ×3 IMPLANT
PAD PREP 24X41 OB/GYN DISP (PERSONAL CARE ITEMS) ×3 IMPLANT
STRIP CLOSURE SKIN 1/2X4 (GAUZE/BANDAGES/DRESSINGS) ×2 IMPLANT
SUT MNCRL AB 4-0 PS2 18 (SUTURE) ×3 IMPLANT
SUT PLAIN 2 0 XLH (SUTURE) IMPLANT
SUT VIC AB 0 CT1 36 (SUTURE) ×12 IMPLANT
SUT VIC AB 3-0 SH 27 (SUTURE) ×2
SUT VIC AB 3-0 SH 27X BRD (SUTURE) ×1 IMPLANT
SWABSTK COMLB BENZOIN TINCTURE (MISCELLANEOUS) ×3 IMPLANT

## 2015-05-31 NOTE — Progress Notes (Signed)
°   05/31/15 1500  Clinical Encounter Type  Visited With Patient and family together  Visit Type Initial  Referral From Nurse  Consult/Referral To Chaplain  Spiritual Encounters  Spiritual Needs Other (Comment)  Patient requested Advance Directives.  Left documents for her to review. Chaplain Larose Kellsvelyn Crews Ext [phone]

## 2015-05-31 NOTE — Transfer of Care (Signed)
Immediate Anesthesia Transfer of Care Note  Patient: Sophia Berry  Procedure(s) Performed: Procedure(s): REPEAT CESAREAN SECTION WITH BILATERAL TUBAL LIGATION (N/A)  Patient Location: Women's Unit  Anesthesia Type:Spinal  Level of Consciousness: awake and alert   Airway & Oxygen Therapy: Patient Spontanous Breathing  Post-op Assessment: Report given to RN  Post vital signs: Reviewed  Last Vitals:  Filed Vitals:   05/31/15 0752 05/31/15 1202  BP: 123/65 111/64  Pulse: 88 72  Temp: 36.7 C 36.4 C  Resp: 18 18    Complications: No apparent anesthesia complications

## 2015-05-31 NOTE — Op Note (Signed)
Cesarean Section Procedure Note  Indications: previous uterine incision low-transverse, declines TOLAC  Pre-operative Diagnosis: 39 week 1 day pregnancy, previous uterine incision low-transverse, multiparity desiring permanent sterilization.  Post-operative Diagnosis: same  Surgeon: Hildred LaserAnika Deatrice Spanbauer, MD  Assistants: Yolanda BonineMelody Burr, CNM  Procedure: Repeat low transverse Cesarean Section with bilateral tubal ligation  Anesthesia: Spinal anesthesia  ASA Class: II  Procedure Details: The patient was seen in the Holding Room. The risks, benefits, complications, treatment options, and expected outcomes were discussed with the patient.  The patient concurred with the proposed plan, giving informed consent.  The site of surgery properly noted/marked. The patient was taken to the Operating Room, identified as Sophia Berry and the procedure verified as C-Section Delivery. A Time Out was held and the above information confirmed.  After induction of anesthesia, the patient was draped and prepped in the usual sterile manner. Anesthesia was tested and noted to be adequate. A Pfannenstiel incision was made and carried down through the subcutaneous tissue to the fascia. Fascial incision was made and extended transversely. The fascia was separated from the underlying rectus tissue superiorly and inferiorly. The peritoneum was identified and entered. Peritoneal incision was extended longitudinally. The utero-vesical peritoneal reflection was incised transversely and the bladder flap was bluntly freed from the lower uterine segment. A low transverse uterine incision was made. Delivered from cephalic presentation was a 3850 gram Female with Apgar scores of 8 at one minute and 9 at five minutes.  After the umbilical cord was clamped and cut cord blood was obtained for evaluation. The placenta was removed intact and appeared normal. The uterus was exteriorized and cleared of all clots and debris. The uterine outline,  tubes and ovaries appeared normal.  The uterine incision was closed with running locked sutures of 0-Vicryl.  A second suture of 0-Vicryl was used in an imbricating layer.  Hemostasis was observed.   Attention was then turned to the fallopian tubes, and where the patient's right fallopian tube was identified and grasped with a Babcock clamp.  The tube was then followed out to the vimbria.  The Babcock clamp was then used to grasp the tube approximately 4 cm from the cornual region.  A 3 cm segment of tube was then ligated with a free tie of 0-Chromic using the [place] method and excised.  The left fallopian tube was then ligated in a similar fashion and excised. The tubal lumens were cauterized bilaterally.  Good hemostasis was noted with bilateral fallopian tubes. The uterus was then returned to the abdomen.   Lavage was carried out until clear. The fascia was then reapproximated with a running suture of 0 Vicryl. The subcutaneous fat layer was reapproximated with 3-0 Vicryl.  The skin was reapproximated with 4-0 Monocryl.  Instrument, sponge, and needle counts were correct prior the abdominal closure and at the conclusion of the case.   Findings: Female infant, cephalic presentation, 3850 grams, with Apgar scores of 8 at one minute and 9 at five minutes. Intact placenta with 3 vessel cord.  The uterine outline, tubes and ovaries appeared normal.   Estimated Blood Loss: 600 ml      Drains: foley catheter to gravity drainage, 75 clear urine at end of the procedure         Total IV Fluids:  1000 ml  Specimens: Placenta and Disposition:  Sent to Pathology         Implants: None         Complications:  None; patient tolerated the  procedure well.         Disposition: PACU - hemodynamically stable.         Condition: stable   Hildred Laser, MD [facility]

## 2015-05-31 NOTE — Anesthesia Preprocedure Evaluation (Signed)
Anesthesia Evaluation  Patient identified by MRN, date of birth, ID band Patient awake    Reviewed: Allergy & Precautions, H&P , NPO status , Patient's Chart, lab work & pertinent test results  History of Anesthesia Complications Negative for: history of anesthetic complications  Airway Mallampati: III  TM Distance: >3 FB Neck ROM: full    Dental  (+) Poor Dentition   Pulmonary shortness of breath and with exertion,    Pulmonary exam normal breath sounds clear to auscultation       Cardiovascular Exercise Tolerance: Good (-) hypertension(-) angina(-) Past MI and (-) DOE negative cardio ROS Normal cardiovascular exam Rhythm:regular Rate:Normal     Neuro/Psych  Headaches, negative psych ROS   GI/Hepatic Neg liver ROS, GERD  Controlled,  Endo/Other  negative endocrine ROS  Renal/GU negative Renal ROS  negative genitourinary   Musculoskeletal   Abdominal   Peds  Hematology negative hematology ROS (+)   Anesthesia Other Findings Past Medical History:   Hyperlipidemia                                               Anemia                                                       Migraines                                                    Shortness of breath dyspnea                                  GERD (gastroesophageal reflux disease)                         Comment:with preg.  Past Surgical History:   CESAREAN SECTION                                             BMI    Body Mass Index   38.93 kg/m 2      Reproductive/Obstetrics negative OB ROS                             Anesthesia Physical Anesthesia Plan  ASA: III  Anesthesia Plan: Spinal   Post-op Pain Management:    Induction:   Airway Management Planned:   Additional Equipment:   Intra-op Plan:   Post-operative Plan:   Informed Consent: I have reviewed the patients History and Physical, chart, labs and discussed the  procedure including the risks, benefits and alternatives for the proposed anesthesia with the patient or authorized representative who has indicated his/her understanding and acceptance.   Dental Advisory Given  Plan Discussed with: Anesthesiologist, CRNA and Surgeon  Anesthesia Plan Comments:         Anesthesia Quick Evaluation

## 2015-05-31 NOTE — Anesthesia Procedure Notes (Signed)
Spinal Patient location during procedure: OR Staffing Anesthesiologist: Katy Fitch K Resident/CRNA: Rolla Plate Performed by: resident/CRNA  Preanesthetic Checklist Completed: patient identified, site marked, surgical consent, pre-op evaluation, timeout performed, IV checked, risks and benefits discussed and monitors and equipment checked Spinal Block Patient position: sitting Prep: Betadine and site prepped and draped Patient monitoring: heart rate, continuous pulse ox, blood pressure and cardiac monitor Approach: midline Location: L4-5 Injection technique: single-shot Needle Needle type: Whitacre and Introducer  Needle gauge: 24 G Needle length: 12.7 cm Additional Notes Negative paresthesia. Negative blood return. Positive free-flowing CSF. Expiration date of kit checked and confirmed. Patient tolerated procedure well, without complications.

## 2015-05-31 NOTE — H&P (Signed)
Obstetric Preoperative History and Physical  Sophia Berry is a 25 y.o. [id number] with IUP at 5851w1d presenting for presenting for scheduled repeat cesarean section with BTL.  No acute concerns.   Prenatal Course Source of Care: [facility](Sophia Grandwood ParkShambley, CNM)  with onset of care at 22 weeks ([facility])  Pregnancy complications or risks: Patient Active Problem List   Diagnosis Date Noted  . Dizziness 05/04/2015  . Labor and delivery, indication for care 03/23/2015  . Yeast infection of the vagina 03/23/2015  . Previous cesarean delivery, antepartum 02/02/2015  . Adiposity 01/05/2015  . Encounter for supervision of normal pregnancy in second trimester 01/05/2015   She plans to breastfeed She desires bilateral tubal ligation for postpartum contraception.   Prenatal labs and studies: ABO, Rh: --/--/O POS (04/07 1402) Antibody: NEG (04/07 1401) Rubella:  Immune (11/15 0904) RPR: Non Reactive (04/07 1401)  HBsAg:  Non-reactive (11/15 0904) HIV:   Negative (11/15 0904) GBS:  Negative (3/23 1044) 1 hr Glucola  Abnormal (143), with normal 3 hour glucola Genetic screening normal Anatomy US normal   Past Medical History  Diagnosis Date  . Hyperlipidemia   . Anemia   . Migraines   . Shortness of breath dyspnea   . GERD (gastroesophageal reflux disease)     with preg.    Past Surgical History  Procedure Laterality Date  . Cesarean section      OB History  Gravida Para Term Preterm AB SAB TAB Ectopic Multiple Living  3 2 2       2     # Outcome Date GA Lbr Len/2nd Weight Sex Delivery Anes PTL Lv  3 Current           2 Term 02/15/13 3476w0d  7 lb 14 oz (3.572 kg) F CS-Unspec  N Y  1 Term 05/02/08 5541w0d  6 lb 14 oz (3.118 kg) F Vag-Spont   Y    Obstetric Comments  Fetal distress.    Social History   Social History  . Marital Status: Married    Spouse Name: N/A  . Number of Children: N/A  . Years of Education: N/A   Occupational History   . homemaker    Social History Main Topics  . Smoking status: Never Smoker   . Smokeless tobacco: Never Used  . Alcohol Use: No  . Drug Use: No  . Sexual Activity:    Partners: Male   Other Topics Concern  . None   Social History Narrative    Family History  Problem Relation Age of Onset  . Asthma Mother   . Breast cancer Maternal Aunt     Great  . Cancer Maternal Grandmother     mat. great grandmother/unsure of what kind  . Diabetes Other     mat great grandmother  . Heart failure Other     mat great grandmother  . Migraines Mother     Prescriptions prior to admission  Medication Sig Dispense Refill Last Dose  . ferrous sulfate 325 (65 FE) MG tablet Take 325 mg by mouth daily with breakfast.   Taking  . Prenatal Vit-Fe Fumarate-FA (PRENATAL MULTIVITAMIN) TABS tablet Take 1 tablet by mouth daily at 12 noon. Reported on 03/23/2015   Taking    No Known Allergies  Review of Systems: Negative except for what is mentioned in HPI.  Physical Exam: BP 123/65 mmHg  Pulse 88  Temp(Src) 98.1 F (36.7 C) (Oral)  Resp 18  Ht   (1.651 m)  Wt 234 lb (106.142 kg)  BMI 38.94 kg/m2  LMP 08/30/2014 (Exact Date) FHR by Doppler: 135 bpm GENERAL: Well-developed, well-nourished female in no acute distress.  LUNGS: Clear to auscultation bilaterally.  HEART: Regular rate and rhythm. ABDOMEN: Soft, nontender, nondistended, gravid, well-healed Pfannenstiel incision. PELVIC: Deferred EXTREMITIES: Nontender, no edema, 2+ distal pulses.   Pertinent Labs/Studies:   Results for orders placed or performed during the hospital encounter of 05/28/15 (from the past 72 hour(s))  CBC     Status: Abnormal   Collection Time: 05/28/15  2:01 PM  Result Value Ref Range   WBC 8.2 3.6 - 11.0 K/uL   RBC 3.41 (L) 3.80 - 5.20 MIL/uL   Hemoglobin 9.6 (L) 12.0 - 16.0 g/dL   HCT 91.4 (L) 78.2 - 95.6 %   MCV 83.2 80.0 - 100.0 fL   MCH 28.1 26.0 - 34.0 pg   MCHC 33.8 32.0 - 36.0 g/dL   RDW 21.3  (H) 08.6 - 14.5 %   Platelets 220 150 - 440 K/uL  Rapid HIV screen (HIV 1/2 Ab+Ag)     Status: None   Collection Time: 05/28/15  2:01 PM  Result Value Ref Range   HIV-1 P24 Antigen - HIV24 NON REACTIVE NON REACTIVE   HIV 1/2 Antibodies NON REACTIVE NON REACTIVE   Interpretation (HIV Ag Ab)      A non reactive test result means that HIV 1 or HIV 2 antibodies and HIV 1 p24 antigen were not detected in the specimen.  RPR     Status: None   Collection Time: 05/28/15  2:01 PM  Result Value Ref Range   RPR Ser Ql Non Reactive Non Reactive    Comment: (NOTE) Performed At: [facility] [address] [place], [place] [id number] Mila Homer MD [id number]   Type and screen     Status: None   Collection Time: 05/28/15  2:01 PM  Result Value Ref Range   ABO/RH(D) O POS    Antibody Screen NEG    Sample Expiration 05/31/2015    Extend sample reason PREGNANT WITHIN 3 MONTHS, UNABLE TO EXTEND   ABO/Rh     Status: None   Collection Time: 05/28/15  2:02 PM  Result Value Ref Range   ABO/RH(D) O POS     Assessment and Plan:Sophia Berry is a 25 y.o. G3P2002 at [redacted]w[redacted]d being admitted  for scheduled cesarean section delivery . The patient is understanding of the planned procedure and is aware of and accepting of all surgical risks, including but not limited to: bleeding which may require transfusion or reoperation; infection which may require antibiotics; injury to bowel, bladder, ureters or other surrounding organs which may require repair; injury to the fetus; need for additional procedures including hysterectomy in the event of life-threatening complications; placental abnormalities wth subsequent pregnancies; incisional problems; blood clot disorders which may require blood thinners;, and other postoperative/anesthesia complications. The patient is in agreement with the proposed plan, and gives informed written consent for the procedure. All questions have been answered. Patient  desires permanent sterilization.  Other reversible forms of contraception were discussed with patient; she declines all other modalities. Risks of procedure discussed with patient including but not limited to: risk of regret, permanence of method, bleeding, infection, injury to surrounding organs and need for additional procedures.  Failure risk of 1-2 % with increased risk of ectopic gestation if pregnancy occurs was also discussed with patient.  Patient verbalized understanding of these risks  and wants to proceed with sterilization.  Written informed consent previously obtained.  To OR when ready.  Anemia of pregnancy noted. Will treat postpartum as indicated.  Currently asymptomatic.    Hildred Laser, MD [facility]

## 2015-06-01 LAB — CBC
HCT: 27.4 % — ABNORMAL LOW (ref 35.0–47.0)
Hemoglobin: 9.4 g/dL — ABNORMAL LOW (ref 12.0–16.0)
MCH: 29 pg (ref 26.0–34.0)
MCHC: 34.5 g/dL (ref 32.0–36.0)
MCV: 84.1 fL (ref 80.0–100.0)
Platelets: 182 10*3/uL (ref 150–440)
RBC: 3.26 MIL/uL — ABNORMAL LOW (ref 3.80–5.20)
RDW: 16.8 % — ABNORMAL HIGH (ref 11.5–14.5)
WBC: 8.4 10*3/uL (ref 3.6–11.0)

## 2015-06-01 LAB — SURGICAL PATHOLOGY

## 2015-06-01 NOTE — Progress Notes (Signed)
Postpartum Day #1: Cesarean Delivery  Subjective: Patient reports left sided mild incisional pain and no problems voiding.  Tolerating diet.   Objective: Vital signs in last 24 hours: Temp:  [97.5 F (36.4 C)-98.9 F (37.2 C)] 98.1 F (36.7 C) (04/11 1126) Pulse Rate:  [68-88] 80 (04/11 1126) Resp:  [16-20] 20 (04/11 1126) BP: (97-115)/(48-61) 102/55 mmHg (04/11 1126) SpO2:  [97 %-100 %] 100 % (04/11 0757)  Physical Exam:  General: alert and no distress Lochia: appropriate Uterine Fundus: firm Incision: healing well, no significant drainage, no dehiscence, no significant erythema DVT Evaluation: Negative Homan's sign. No cords or calf tenderness. No significant calf/ankle edema.   CBC Latest Ref Rng 06/01/2015 05/28/2015  WBC 3.6 - 11.0 K/uL 8.4 8.2  Hemoglobin 12.0 - 16.0 g/dL 4.0(J9.4(L) 8.1(X9.6(L)  Hematocrit 35.0 - 47.0 % 27.4(L) 28.3(L)  Platelets 150 - 440 K/uL 182 220    Assessment/Plan: Status post Cesarean section. Doing well postoperatively.  Continue current care.  Sophia Berry 06/01/2015, 12:20 PM

## 2015-06-01 NOTE — Progress Notes (Signed)
Subjective: Post Op Day 1 Day Post-Op: Cesarean Delivery Patient reports incisional pain and tolerating PO.    Objective: Vital signs in last 24 hours: Temp:  [97.5 F (36.4 C)-98.9 F (37.2 C)] 97.6 F (36.4 C) (04/11 0437) Pulse Rate:  [68-88] 88 (04/11 0437) Resp:  [16-20] 18 (04/11 0437) BP: (97-115)/(48-64) 110/55 mmHg (04/11 0437) SpO2:  [96 %-100 %] 99 % (04/11 0437)  Physical Exam:  General: alert, cooperative, appears stated age, moderately obese and pale Lochia: appropriate Uterine Fundus: firm Incision: healing well, no significant drainage DVT Evaluation: No evidence of DVT seen on physical exam. Negative Homan's sign.   Recent Labs  06/01/15 0547  HGB 9.4*  HCT 27.4*   Infant "Zoey" breast feeding well.  Assessment/Plan: Status post Cesarean section. Doing well postoperatively.  Continue current care. Dressing removed, encouraged ambulation and shower today.  Sophia Berry Brynnley Dayrit, CNM 06/01/2015, 7:52 AM

## 2015-06-01 NOTE — Anesthesia Postprocedure Evaluation (Signed)
Anesthesia Post Note  Patient: Sophia Berry  Procedure(s) Performed: Procedure(s) (LRB): REPEAT CESAREAN SECTION WITH BILATERAL TUBAL LIGATION (N/A)  Patient location during evaluation: Mother Baby Anesthesia Type: Spinal Level of consciousness: awake and alert Pain management: satisfactory to patient Vital Signs Assessment: post-procedure vital signs reviewed and stable Respiratory status: spontaneous breathing Cardiovascular status: stable Postop Assessment: no headache, no backache, spinal receding and patient able to bend at knees Anesthetic complications: no    Last Vitals:  Filed Vitals:   06/01/15 0005 06/01/15 0437  BP: 99/54 110/55  Pulse: 79 88  Temp: 36.4 C 36.4 C  Resp: 18 18    Last Pain:  Filed Vitals:   06/01/15 0437  PainSc: 8                  Mathews ArgyleLogan,  Evren Shankland P

## 2015-06-01 NOTE — Anesthesia Post-op Follow-up Note (Signed)
  Anesthesia Pain Follow-up Note  Patient: Sophia Berry  Day #: 1  Date of Follow-up: 06/01/2015 Time: 7:28 AM  Last Vitals:  Filed Vitals:   06/01/15 0005 06/01/15 0437  BP: 99/54 110/55  Pulse: 79 88  Temp: 36.4 C 36.4 C  Resp: 18 18    Level of Consciousness: alert  Pain: 6 /10   Side Effects:None  Catheter Site Exam:clean  Plan: D/C from anesthesia care  [name],  [name]

## 2015-06-02 MED ORDER — IBUPROFEN 800 MG PO TABS: 800.0000 mg | ORAL_TABLET | Freq: Four times a day (QID) | ORAL | Status: DC

## 2015-06-02 MED ORDER — FUSION PLUS PO CAPS: 1.0000 | ORAL_CAPSULE | Freq: Every day | ORAL | Status: DC

## 2015-06-02 MED ORDER — DOCUSATE SODIUM 100 MG PO CAPS: 100.0000 mg | ORAL_CAPSULE | Freq: Two times a day (BID) | ORAL | Status: DC | PRN

## 2015-06-02 MED ORDER — VITAMIN D3 125 MCG (5000 UT) PO CAPS: 1.0000 | ORAL_CAPSULE | Freq: Every day | ORAL | Status: DC

## 2015-06-02 MED ORDER — OXYCODONE-ACETAMINOPHEN 5-325 MG PO TABS: 2.0000 | ORAL_TABLET | ORAL | Status: DC | PRN

## 2015-06-02 NOTE — Progress Notes (Signed)
Patient discharged home with infant and spouse. Discharge instructions, prescriptions and follow up appointment given to and reviewed with patient and spouse. Patient verbalized understanding. Escorted out via wheelchair by auxillary.

## 2015-06-02 NOTE — Discharge Summary (Signed)
Obstetric Discharge Summary Reason for Admission: cesarean section Prenatal Procedures: ultrasound Intrapartum Procedures: cesarean: low cervical, transverse and tubal ligation Postpartum Procedures: none Complications-Operative and Postpartum: none HEMOGLOBIN  Date Value Ref Range Status  06/01/2015 9.4* 12.0 - 16.0 g/dL Final   HGB  Date Value Ref Range Status  02/15/2013 11.3* 12.0-16.0 g/dL Final   HCT  Date Value Ref Range Status  06/01/2015 27.4* 35.0 - 47.0 % Final  02/16/2013 30.4* 35.0-47.0 % Final   HEMATOCRIT  Date Value Ref Range Status  03/05/2015 30.1* 34.0 - 46.6 % Final    Physical Exam:  General: alert, cooperative, appears stated age, moderately obese and pale Lochia: appropriate Uterine Fundus: firm Incision: healing well, no significant drainage DVT Evaluation: No evidence of DVT seen on physical exam. Negative Homan's sign.  Discharge Diagnoses: Term Pregnancy-delivered and repeat c/s with BTL, postpartum anemia  Discharge Information: Date: 06/02/2015 Activity: pelvic rest Diet: routine Medications: PNV, Tylenol #3, Ibuprofen, Colace and Iron Condition: stable Instructions: refer to practice specific booklet Discharge to: home   Newborn Data: Live born female  Birth Weight: 8 lb 7.8 oz (3850 g) APGAR: 8, 9  Home with mother.  Danh Bayus N Mustapha Colson 06/02/2015, 9:05 AM

## 2015-06-08 ENCOUNTER — Encounter: Payer: Self-pay | Admitting: Obstetrics and Gynecology

## 2015-06-08 ENCOUNTER — Ambulatory Visit: Payer: Medicaid Other | Admitting: Obstetrics and Gynecology

## 2015-06-08 ENCOUNTER — Ambulatory Visit (INDEPENDENT_AMBULATORY_CARE_PROVIDER_SITE_OTHER): Payer: Medicaid Other | Admitting: Obstetrics and Gynecology

## 2015-06-08 VITALS — BP 110/75 | HR 93 | Wt 219.8 lb

## 2015-06-08 DIAGNOSIS — Z98891 History of uterine scar from previous surgery: Secondary | ICD-10-CM

## 2015-06-08 NOTE — Patient Instructions (Signed)
  Place gynecology postoperative visit patient instructions here.  

## 2015-06-08 NOTE — Progress Notes (Signed)
  Subjective:     Sophia Berry is a 25 y.o. female who presents to the clinic 1 weeks status post repeat LTCS for delivery. Eating a regular diet without difficulty. Bowel movements are normal. Pain is controlled without any medications.  The following portions of the patient's history were reviewed and updated as appropriate: allergies, current medications, past family history, past medical history, past social history, past surgical history and problem list.  Review of Systems A comprehensive review of systems was negative.    Objective:    BP 110/75 mmHg  Pulse 93  Wt 219 lb 12.8 oz (99.701 kg)  Breastfeeding? Yes General:  alert, cooperative and appears stated age  Abdomen: soft, bowel sounds active, non-tender  Incision:   healing well, no drainage, no erythema, no hernia, no seroma, no swelling, no dehiscence, incision well approximated     Assessment:    Doing well postoperatively. Operative findings again reviewed. Pathology report discussed.    Plan:    1. Continue any current medications. 2. Wound care discussed. 3. Activity restrictions: no lifting more than 11 pounds 4. Anticipated return to work: not applicable. 5. Follow up: 5 weeks for postpartum visit.

## 2015-06-14 ENCOUNTER — Telehealth: Payer: Self-pay | Admitting: Obstetrics and Gynecology

## 2015-06-14 NOTE — Telephone Encounter (Signed)
Sophia Berry TIIK HER BABY TO A 2WKS VISIT AND SHE FILLED OUT A QUESTIONAIRE AND THE RESULTS WERE THAT SHE SCORED HIGH IN POST PARTUM DEPRESSION, SHE WAS TOLD TO CALL US. DOES SHE NEED AN APPT AND IF SO WILL YOU TELL ME WHEN B/C SCH IS BOOKED.

## 2015-06-15 NOTE — Telephone Encounter (Signed)
Spoke with pt she is going to give it another week and then if she feels she needs medication she will call us

## 2015-07-16 ENCOUNTER — Ambulatory Visit: Payer: Medicaid Other | Admitting: Obstetrics and Gynecology

## 2015-07-16 ENCOUNTER — Ambulatory Visit (INDEPENDENT_AMBULATORY_CARE_PROVIDER_SITE_OTHER): Payer: Medicaid Other | Admitting: Obstetrics and Gynecology

## 2015-07-16 ENCOUNTER — Encounter: Payer: Self-pay | Admitting: Obstetrics and Gynecology

## 2015-07-16 DIAGNOSIS — F53 Postpartum depression: Secondary | ICD-10-CM

## 2015-07-16 DIAGNOSIS — O99345 Other mental disorders complicating the puerperium: Secondary | ICD-10-CM

## 2015-07-16 DIAGNOSIS — E669 Obesity, unspecified: Secondary | ICD-10-CM

## 2015-07-16 MED ORDER — SERTRALINE HCL 50 MG PO TABS: 50.0000 mg | ORAL_TABLET | Freq: Every day | ORAL | Status: DC

## 2015-07-16 NOTE — Progress Notes (Signed)
  Subjective:     Sophia Berry is a 25 y.o. female who presents for a postpartum visit. She is 6 weeks postpartum following a repeat LTCS & BTL. I have fully reviewed the prenatal and intrapartum course. The delivery was at 39 gestational weeks. Outcome: repeat cesarean section, low transverse incision. Anesthesia: spinal. Postpartum course has been complicated by depression- living with inlaws and they want them to move out and having trouble findings a job, feels very anxious and depressed.. Baby's course has been uncomplicated. Baby is feeding by formula only. Bleeding no bleeding. Bowel function is normal. Bladder function is normal. Patient is not sexually active. Contraception method is tubal ligation. Postpartum depression screening: positive.  The following portions of the patient's history were reviewed and updated as appropriate: allergies, current medications, past family history, past medical history, past social history, past surgical history and problem list.  Review of Systems Pertinent items noted in HPI and remainder of comprehensive ROS otherwise negative.   Depression screen PHQ 2/9 07/16/2015  Decreased Interest 1  Down, Depressed, Hopeless 3  PHQ - 2 Score 4  Altered sleeping 3  Tired, decreased energy 3  Change in appetite 1  Feeling bad or failure about yourself  3  Trouble concentrating 2  Moving slowly or fidgety/restless 1  Suicidal thoughts 2  PHQ-9 Score 19  Difficult doing work/chores Extremely dIfficult     Objective:    BP 94/56 mmHg  Pulse 92  Ht 5\' 5"  (1.651 m)  Wt 222 lb 12.8 oz (101.061 kg)  BMI 37.08 kg/m2  Breastfeeding? No  General:  alert, cooperative, appears stated age and moderately obese   Breasts:  not examined  Lungs: clear to auscultation bilaterally  Heart:  regular rate and rhythm, S1, S2 normal, no murmur, click, rub or gallop  Abdomen: soft, non-tender; bowel sounds normal; no masses,  no organomegaly   Vulva:  normal   Vagina: normal vagina, no discharge, exudate, lesion, or erythema  Cervix:  multiparous appearance  Corpus: normal size, contour, position, consistency, mobility, non-tender  Adnexa:  no mass, fullness, tenderness  Rectal Exam: Not performed.        Assessment:     6 weeks postpartum exam. Pap smear not done at today's visit.   Plan:    1. Contraception: tubal ligation 2. PPD- zoloft started 3. Follow up in: 6 weeks or as needed.

## 2015-07-16 NOTE — Patient Instructions (Signed)
Place postpartum visit patient instructions here.   Postpartum Depression and Baby Blues The postpartum period begins right after the birth of a baby. During this time, there is often a great amount of joy and excitement. It is also a time of many changes in the life of the parents. Regardless of how many times a mother gives birth, each child brings new challenges and dynamics to the family. It is not unusual to have feelings of excitement along with confusing shifts in moods, emotions, and thoughts. All mothers are at risk of developing postpartum depression or the "baby blues." These mood changes can occur right after giving birth, or they may occur many months after giving birth. The baby blues or postpartum depression can be mild or severe. Additionally, postpartum depression can go away rather quickly, or it can be a long-term condition.  CAUSES Raised hormone levels and the rapid drop in those levels are thought to be a main cause of postpartum depression and the baby blues. A number of hormones change during and after pregnancy. Estrogen and progesterone usually decrease right after the delivery of your baby. The levels of thyroid hormone and various cortisol steroids also rapidly drop. Other factors that play a role in these mood changes include major life events and genetics.  RISK FACTORS If you have any of the following risks for the baby blues or postpartum depression, know what symptoms to watch out for during the postpartum period. Risk factors that may increase the likelihood of getting the baby blues or postpartum depression include:  Having a personal or family history of depression.   Having depression while being pregnant.   Having premenstrual mood issues or mood issues related to oral contraceptives.  Having a lot of life stress.   Having marital conflict.   Lacking a social support network.   Having a baby with special needs.   Having health problems, such as  diabetes.  SIGNS AND SYMPTOMS Symptoms of baby blues include:  Brief changes in mood, such as going from extreme happiness to sadness.  Decreased concentration.   Difficulty sleeping.   Crying spells, tearfulness.   Irritability.   Anxiety.  Symptoms of postpartum depression typically begin within the first month after giving birth. These symptoms include:  Difficulty sleeping or excessive sleepiness.   Marked weight loss.   Agitation.   Feelings of worthlessness.   Lack of interest in activity or food.  Postpartum psychosis is a very serious condition and can be dangerous. Fortunately, it is rare. Displaying any of the following symptoms is cause for immediate medical attention. Symptoms of postpartum psychosis include:   Hallucinations and delusions.   Bizarre or disorganized behavior.   Confusion or disorientation.  DIAGNOSIS  A diagnosis is made by an evaluation of your symptoms. There are no medical or lab tests that lead to a diagnosis, but there are various questionnaires that a health care provider may use to identify those with the baby blues, postpartum depression, or psychosis. Often, a screening tool called the Edinburgh Postnatal Depression Scale is used to diagnose depression in the postpartum period.  TREATMENT The baby blues usually goes away on its own in 1-2 weeks. Social support is often all that is needed. You will be encouraged to get adequate sleep and rest. Occasionally, you may be given medicines to help you sleep.  Postpartum depression requires treatment because it can last several months or longer if it is not treated. Treatment may include individual or group therapy, medicine, or   both to address any social, physiological, and psychological factors that may play a role in the depression. Regular exercise, a healthy diet, rest, and social support may also be strongly recommended.  Postpartum psychosis is more serious and needs treatment  right away. Hospitalization is often needed. HOME CARE INSTRUCTIONS  Get as much rest as you can. Nap when the baby sleeps.   Exercise regularly. Some women find yoga and walking to be beneficial.   Eat a balanced and nourishing diet.   Do little things that you enjoy. Have a cup of tea, take a bubble bath, read your favorite magazine, or listen to your favorite music.  Avoid alcohol.   Ask for help with household chores, cooking, grocery shopping, or running errands as needed. Do not try to do everything.   Talk to people close to you about how you are feeling. Get support from your partner, family members, friends, or other new moms.  Try to stay positive in how you think. Think about the things you are grateful for.   Do not spend a lot of time alone.   Only take over-the-counter or prescription medicine as directed by your health care provider.  Keep all your postpartum appointments.   Let your health care provider know if you have any concerns.  SEEK MEDICAL CARE IF: You are having a reaction to or problems with your medicine. SEEK IMMEDIATE MEDICAL CARE IF:  You have suicidal feelings.   You think you may harm the baby or someone else. MAKE SURE YOU:  Understand these instructions.  Will watch your condition.  Will get help right away if you are not doing well or get worse.   This information is not intended to replace advice given to you by your health care provider. Make sure you discuss any questions you have with your health care provider.   Document Released: 11/11/2003 Document Revised: 02/11/2013 Document Reviewed: 11/18/2012 Elsevier Interactive Patient Education 2016 [employer]  

## 2015-07-17 LAB — CBC
Hematocrit: 38.2 % (ref 34.0–46.6)
Hemoglobin: 12.6 g/dL (ref 11.1–15.9)
MCH: 26.8 pg (ref 26.6–33.0)
MCHC: 33 g/dL (ref 31.5–35.7)
MCV: 81 fL (ref 79–97)
Platelets: 365 10*3/uL (ref 150–379)
RBC: 4.7 x10E6/uL (ref 3.77–5.28)
RDW: 13.8 % (ref 12.3–15.4)
WBC: 6.5 10*3/uL (ref 3.4–10.8)

## 2015-07-17 LAB — IRON: Iron: 45 ug/dL (ref 27–159)

## 2015-07-17 LAB — VITAMIN D 25 HYDROXY (VIT D DEFICIENCY, FRACTURES): Vit D, 25-Hydroxy: 30.5 ng/mL (ref 30.0–100.0)

## 2015-08-18 ENCOUNTER — Telehealth: Payer: Self-pay | Admitting: *Deleted

## 2015-08-18 ENCOUNTER — Other Ambulatory Visit: Payer: Self-pay | Admitting: *Deleted

## 2015-08-18 MED ORDER — SERTRALINE HCL 50 MG PO TABS: 50.0000 mg | ORAL_TABLET | Freq: Every day | ORAL | Status: DC

## 2015-08-18 NOTE — Telephone Encounter (Signed)
Patient states that she needs a refill of her Zoloft. She states he is moving to another state and wont be able to make it to her appt on 08/26/15. She is wondering if she can get a RX to last her until she establish with a new provider. [facility] [address] Thanks

## 2015-08-18 NOTE — Telephone Encounter (Signed)
Done-ac 

## 2015-08-26 ENCOUNTER — Ambulatory Visit: Payer: Medicaid Other | Admitting: Obstetrics and Gynecology

## 2016-11-29 ENCOUNTER — Emergency Department
Admission: EM | Admit: 2016-11-29 | Discharge: 2016-11-29 | Disposition: A | Discharge status: Home / Self Care | Payer: Self-pay | Attending: Student in an Organized Health Care Education/Training Program | Admitting: Student in an Organized Health Care Education/Training Program

## 2016-11-29 ENCOUNTER — Encounter: Payer: Self-pay | Admitting: Emergency Medicine

## 2016-11-29 ENCOUNTER — Emergency Department: Payer: Self-pay

## 2016-11-29 DIAGNOSIS — Z79899 Other long term (current) drug therapy: Secondary | ICD-10-CM | POA: Insufficient documentation

## 2016-11-29 DIAGNOSIS — R112 Nausea with vomiting, unspecified: Secondary | ICD-10-CM | POA: Insufficient documentation

## 2016-11-29 DIAGNOSIS — R1031 Right lower quadrant pain: Secondary | ICD-10-CM | POA: Insufficient documentation

## 2016-11-29 DIAGNOSIS — R197 Diarrhea, unspecified: Secondary | ICD-10-CM | POA: Insufficient documentation

## 2016-11-29 LAB — COMPREHENSIVE METABOLIC PANEL
ALT: 19 U/L (ref 14–54)
AST: 21 U/L (ref 15–41)
Albumin: 4.1 g/dL (ref 3.5–5.0)
Alkaline Phosphatase: 56 U/L (ref 38–126)
Anion gap: 10 (ref 5–15)
BUN: 13 mg/dL (ref 6–20)
CO2: 20 mmol/L — ABNORMAL LOW (ref 22–32)
Calcium: 9.1 mg/dL (ref 8.9–10.3)
Chloride: 106 mmol/L (ref 101–111)
Creatinine, Ser: 0.71 mg/dL (ref 0.44–1.00)
GFR calc Af Amer: >60 mL/min (ref >60)
GFR calc non Af Amer: >60 mL/min (ref >60)
Glucose, Bld: 112 mg/dL — ABNORMAL HIGH (ref 65–99)
Potassium: 3.9 mmol/L (ref 3.5–5.1)
Sodium: 136 mmol/L (ref 135–145)
Total Bilirubin: 1 mg/dL (ref 0.3–1.2)
Total Protein: 7.2 g/dL (ref 6.5–8.1)

## 2016-11-29 LAB — URINALYSIS, COMPLETE (UACMP) WITH MICROSCOPIC
Bilirubin Urine: NEGATIVE
Glucose, UA: NEGATIVE mg/dL
Hgb urine dipstick: NEGATIVE
Ketones, ur: 20 mg/dL — AB
Leukocytes, UA: NEGATIVE
Nitrite: NEGATIVE
Protein, ur: NEGATIVE mg/dL
Specific Gravity, Urine: 1.018 (ref 1.005–1.030)
pH: 5 (ref 5.0–8.0)

## 2016-11-29 LAB — CBC
HCT: 42.2 % (ref 35.0–47.0)
Hemoglobin: 14.5 g/dL (ref 12.0–16.0)
MCH: 28.4 pg (ref 26.0–34.0)
MCHC: 34.3 g/dL (ref 32.0–36.0)
MCV: 82.7 fL (ref 80.0–100.0)
Platelets: 240 10*3/uL (ref 150–440)
RBC: 5.1 MIL/uL (ref 3.80–5.20)
RDW: 14.6 % — ABNORMAL HIGH (ref 11.5–14.5)
WBC: 12.4 10*3/uL — ABNORMAL HIGH (ref 3.6–11.0)

## 2016-11-29 LAB — LIPASE, BLOOD: Lipase: 29 U/L (ref 11–51)

## 2016-11-29 LAB — PREGNANCY, URINE: Preg Test, Ur: NEGATIVE

## 2016-11-29 MED ORDER — IOPAMIDOL (ISOVUE-300) INJECTION 61%
100.0000 mL | Freq: Once | INTRAVENOUS | Status: AC | PRN
  Administered 2016-11-29: 100 mL via INTRAVENOUS
  Filled 2016-11-29: qty 100

## 2016-11-29 MED ORDER — PROMETHAZINE HCL 12.5 MG PO TABS: 12.5000 mg | ORAL_TABLET | Freq: Four times a day (QID) | ORAL | 0 refills | Status: DC | PRN

## 2016-11-29 MED ORDER — ONDANSETRON 4 MG PO TBDP
4.0000 mg | ORAL_TABLET | Freq: Once | ORAL | Status: AC | PRN
  Administered 2016-11-29: 4 mg via ORAL
  Filled 2016-11-29: qty 1

## 2016-11-29 MED ORDER — PROMETHAZINE HCL 25 MG/ML IJ SOLN
12.5000 mg | Freq: Four times a day (QID) | INTRAMUSCULAR | Status: DC | PRN
  Administered 2016-11-29: 12.5 mg via INTRAVENOUS
  Filled 2016-11-29: qty 1

## 2016-11-29 NOTE — ED Triage Notes (Signed)
Pt arrived via POV from home with reports of N/V/D since 11am this morning. Pt has had about 5 episodes of vomiting and diarrhea.  Pt states she cannot keep water down and vomits about 20 mins after.

## 2016-11-29 NOTE — ED Provider Notes (Signed)
[facility] Emergency Department Provider Note    First MD Initiated Contact with Patient 11/29/16 1952     (approximate)  I have reviewed the triage vital signs and the nursing notes.   HISTORY  Chief Complaint Emesis; Diarrhea; and Abdominal Pain    HPI Sophia Berry is a 26 y.o. female Chief complaint of  right lower quadrant abdominal pain as well as nausea vomiting and several episodes of watery diarrhea. describes the pain as achy incontinence that is moderate in severity.Hasn't been able to keep any food down she says. Denies any measured fevers. Denies any dysuria. No bloody stools. No sick contacts.denies any vaginal discharge. Denies any chance of being pregnant. No dysuria.   Past Medical History:  Diagnosis Date  . Anemia   . GERD (gastroesophageal reflux disease)    with preg.  Marland Kitchen Hyperlipidemia   . Migraines   . Shortness of breath dyspnea    Family History  Problem Relation Age of Onset  . Asthma Mother   . Migraines Mother   . Cancer Maternal Grandmother        mat. great grandmother/unsure of what kind  . Breast cancer Maternal Aunt        Great  . Diabetes Other        mat great grandmother  . Heart failure Other        mat great grandmother   Past Surgical History:  Procedure Laterality Date  . CESAREAN SECTION    . CESAREAN SECTION WITH BILATERAL TUBAL LIGATION N/A 05/31/2015   Procedure: REPEAT CESAREAN SECTION WITH BILATERAL TUBAL LIGATION;  Surgeon: Hildred Laser, MD;  Location: [facility] ORS;  Service: Obstetrics;  Laterality: N/A;   Patient Active Problem List   Diagnosis Date Noted  . S/P cesarean section 05/31/2015      Prior to Admission medications   Medication Sig Start Date End Date Taking? Authorizing Provider  Cholecalciferol (VITAMIN D3) 5000 units CAPS Take 1 capsule (5,000 Units total) by mouth daily. Patient not taking: Reported on 07/16/2015 06/02/15   Shambley, Melody N, CNM  docusate sodium  (COLACE) 100 MG capsule Take 1 capsule (100 mg total) by mouth 2 (two) times daily as needed. Patient not taking: Reported on 06/08/2015 06/02/15   Shambley, Melody N, CNM  ibuprofen (ADVIL,MOTRIN) 800 MG tablet Take 1 tablet (800 mg total) by mouth every 6 (six) hours. Patient not taking: Reported on 07/16/2015 06/02/15   Shambley, Melody N, CNM  Iron-FA-B Cmp-C-Biot-Probiotic (FUSION PLUS) CAPS Take 1 capsule by mouth daily. Patient not taking: Reported on 07/16/2015 06/02/15   Purcell Nails, CNM  oxyCODONE-acetaminophen (PERCOCET/ROXICET) 5-325 MG tablet Take 2 tablets by mouth every 4 (four) hours as needed (pain scale > 7). Patient not taking: Reported on 07/16/2015 06/02/15   Purcell Nails, CNM  Prenatal Vit-Fe Fumarate-FA (PRENATAL MULTIVITAMIN) TABS tablet Take 1 tablet by mouth daily at 12 noon. Reported on 07/16/2015    [provider]  promethazine (PHENERGAN) 12.5 MG tablet Take 1 tablet (12.5 mg total) by mouth every 6 (six) hours as needed for nausea or vomiting. 11/29/16   Willy Eddy, MD  sertraline (ZOLOFT) 50 MG tablet Take 1 tablet (50 mg total) by mouth daily. 08/18/15   Purcell Nails, CNM    Allergies Patient has no known allergies.    Social History Social History  Substance Use Topics  . Smoking status: Never Smoker  . Smokeless tobacco: Never Used  . Alcohol use No  Review of Systems Patient denies headaches, rhinorrhea, blurry vision, numbness, shortness of breath, chest pain, edema, cough, abdominal pain, nausea, vomiting, diarrhea, dysuria, fevers, rashes or hallucinations unless otherwise stated above in HPI. ____________________________________________   PHYSICAL EXAM:  VITAL SIGNS: Vitals:   11/29/16 1713 11/29/16 2225  BP: 125/66 122/70  Pulse: 96 85  Resp: 20 18  Temp: 98.3 F (36.8 C)   SpO2: 98% 99%    Constitutional: Alert and oriented. Well appearing and in no acute distress. Eyes: Conjunctivae are normal.  Head:  Atraumatic. Nose: No congestion/rhinnorhea. Mouth/Throat: Mucous membranes are moist.   Neck: No stridor. Painless ROM.  Cardiovascular: Normal rate, regular rhythm. Grossly normal heart sounds.  Good peripheral circulation. Respiratory: Normal respiratory effort.  No retractions. Lungs CTAB. Gastrointestinal: Soft with mild rlq ttp no guarding but exam limited 2/2 obesity. No distention. No abdominal bruits. No CVA tenderness. Musculoskeletal: No lower extremity tenderness nor edema.  No joint effusions. Neurologic:  Normal speech and language. No gross focal neurologic deficits are appreciated. No facial droop Skin:  Skin is warm, dry and intact. No rash noted. Psychiatric: Mood and affect are normal. Speech and behavior are normal.  ____________________________________________   LABS (all labs ordered are listed, but only abnormal results are displayed)  Results for orders placed or performed during the hospital encounter of 11/29/16 (from the past 24 hour(s))  Lipase, blood     Status: None   Collection Time: 11/29/16  5:12 PM  Result Value Ref Range   Lipase 29 11 - 51 U/L  Comprehensive metabolic panel     Status: Abnormal   Collection Time: 11/29/16  5:12 PM  Result Value Ref Range   Sodium 136 135 - 145 mmol/L   Potassium 3.9 3.5 - 5.1 mmol/L   Chloride 106 101 - 111 mmol/L   CO2 20 (L) 22 - 32 mmol/L   Glucose, Bld 112 (H) 65 - 99 mg/dL   BUN 13 6 - 20 mg/dL   Creatinine, Ser 1.61 0.44 - 1.00 mg/dL   Calcium 9.1 8.9 - 09.6 mg/dL   Total Protein 7.2 6.5 - 8.1 g/dL   Albumin 4.1 3.5 - 5.0 g/dL   AST 21 15 - 41 U/L   ALT 19 14 - 54 U/L   Alkaline Phosphatase 56 38 - 126 U/L   Total Bilirubin 1.0 0.3 - 1.2 mg/dL   GFR calc non Af Amer >60 >60 mL/min   GFR calc Af Amer >60 >60 mL/min   Anion gap 10 5 - 15  CBC     Status: Abnormal   Collection Time: 11/29/16  5:12 PM  Result Value Ref Range   WBC 12.4 (H) 3.6 - 11.0 K/uL   RBC 5.10 3.80 - 5.20 MIL/uL   Hemoglobin  14.5 12.0 - 16.0 g/dL   HCT 04.5 40.9 - 81.1 %   MCV 82.7 80.0 - 100.0 fL   MCH 28.4 26.0 - 34.0 pg   MCHC 34.3 32.0 - 36.0 g/dL   RDW 91.4 (H) 78.2 - 95.6 %   Platelets 240 150 - 440 K/uL  Urinalysis, Complete w Microscopic     Status: Abnormal   Collection Time: 11/29/16  5:12 PM  Result Value Ref Range   Color, Urine YELLOW (A) YELLOW   APPearance CLEAR (A) CLEAR   Specific Gravity, Urine 1.018 1.005 - 1.030   pH 5.0 5.0 - 8.0   Glucose, UA NEGATIVE NEGATIVE mg/dL   Hgb urine dipstick NEGATIVE NEGATIVE   Bilirubin  Urine NEGATIVE NEGATIVE   Ketones, ur 20 (A) NEGATIVE mg/dL   Protein, ur NEGATIVE NEGATIVE mg/dL   Nitrite NEGATIVE NEGATIVE   Leukocytes, UA NEGATIVE NEGATIVE   RBC / HPF 0-5 0 - 5 RBC/hpf   WBC, UA 0-5 0 - 5 WBC/hpf   Bacteria, UA RARE (A) NONE SEEN   Squamous Epithelial / LPF 0-5 (A) NONE SEEN   Mucus PRESENT   Pregnancy, urine     Status: None   Collection Time: 11/29/16  5:12 PM  Result Value Ref Range   Preg Test, Ur NEGATIVE NEGATIVE   ____________________________________________ _____________________________  RADIOLOGY  I personally reviewed all radiographic images ordered to evaluate for the above acute complaints and reviewed radiology reports and findings.  These findings were personally discussed with the patient.  Please see medical record for radiology report.  ____________________________________________   PROCEDURES  Procedure(s) performed:  Procedures    Critical Care performed: no ____________________________________________   INITIAL IMPRESSION / ASSESSMENT AND PLAN / ED COURSE  Pertinent labs & imaging results that were available during my care of the patient were reviewed by me and considered in my medical decision making (see chart for details).  DDX: appy, colitis, enteritis, uti, pyelo, ectopic  Rena N Carmer is a 26 y.o. who presents to the ED with abdominal discomfort and nausea vomiting and diarrhea as described  above. Patient's afebrile does have mild white count with blood work is otherwise reassuring. No evidence of UTI. Patient is not pregnant. Does not seem clinically consistent with torsion. CT imaging ordered to evaluate for the above differential shows no evidence of acute appendicitis. Patient tolerating oral hydration after IV antiemetics and IV fluid bolus. At this point patient is stable for discharge with further workup as an observation as an outpatient.      ____________________________________________   FINAL CLINICAL IMPRESSION(S) / ED DIAGNOSES  Final diagnoses:  Nausea vomiting and diarrhea  Right lower quadrant abdominal pain      NEW MEDICATIONS STARTED DURING THIS VISIT:  New Prescriptions   PROMETHAZINE (PHENERGAN) 12.5 MG TABLET    Take 1 tablet (12.5 mg total) by mouth every 6 (six) hours as needed for nausea or vomiting.     Note:  This document was prepared using Dragon voice recognition software and may include unintentional dictation errors.    Willy Eddy, MD 11/29/16 2229

## 2016-11-29 NOTE — Discharge Instructions (Signed)

## 2017-03-09 ENCOUNTER — Emergency Department
Admission: EM | Admit: 2017-03-09 | Discharge: 2017-03-09 | Disposition: A | Discharge status: Home / Self Care | Payer: Self-pay | Attending: Emergency Medicine | Admitting: Emergency Medicine

## 2017-03-09 ENCOUNTER — Emergency Department: Payer: Self-pay

## 2017-03-09 ENCOUNTER — Other Ambulatory Visit: Payer: Self-pay

## 2017-03-09 ENCOUNTER — Encounter: Payer: Self-pay | Admitting: *Deleted

## 2017-03-09 DIAGNOSIS — J069 Acute upper respiratory infection, unspecified: Secondary | ICD-10-CM | POA: Insufficient documentation

## 2017-03-09 DIAGNOSIS — J0100 Acute maxillary sinusitis, unspecified: Secondary | ICD-10-CM | POA: Insufficient documentation

## 2017-03-09 DIAGNOSIS — Z79899 Other long term (current) drug therapy: Secondary | ICD-10-CM | POA: Insufficient documentation

## 2017-03-09 MED ORDER — GUAIFENESIN-CODEINE 100-10 MG/5ML PO SOLN: 10.0000 mL | Freq: Three times a day (TID) | ORAL | 0 refills | Status: DC | PRN

## 2017-03-09 MED ORDER — AMOXICILLIN 500 MG PO TABS: 500.0000 mg | ORAL_TABLET | Freq: Three times a day (TID) | ORAL | 0 refills | Status: DC

## 2017-03-09 NOTE — ED Notes (Addendum)
See triage note  States she developed cough and subjective fever last pm  States cough is non prod  Afebrile on arrival

## 2017-03-09 NOTE — ED Triage Notes (Signed)
Pt to ED reporting she has had cough and congestion since Tuesday. Pt reports she has been coughing yellow tinged sputum. Pt reports feeling febrile at home but denies having checked temp. Husband was reported to have been seen with similar symptoms last week.

## 2017-03-09 NOTE — ED Provider Notes (Signed)
[facility] Emergency Department Provider Note  ____________________________________________  Time seen: Approximately 1:14 PM  I have reviewed the triage vital signs and the nursing notes.   HISTORY  Chief Complaint Cough and Nasal Congestion   HPI Sophia Berry is a 27 y.o. female who presents to the emergency department for evaluation of cough, subjective fever, and sinus congestion.  Symptoms started 5 or 6 days ago.  No relief with over-the-counter cold and sinus medications.  Past Medical History:  Diagnosis Date  . Anemia   . GERD (gastroesophageal reflux disease)    with preg.  Marland Kitchen Hyperlipidemia   . Migraines   . Shortness of breath dyspnea     Patient Active Problem List   Diagnosis Date Noted  . S/P cesarean section 05/31/2015    Past Surgical History:  Procedure Laterality Date  . CESAREAN SECTION    . CESAREAN SECTION WITH BILATERAL TUBAL LIGATION N/A 05/31/2015   Procedure: REPEAT CESAREAN SECTION WITH BILATERAL TUBAL LIGATION;  Surgeon: Hildred Laser, MD;  Location: [facility] ORS;  Service: Obstetrics;  Laterality: N/A;    Prior to Admission medications   Medication Sig Start Date End Date Taking? Authorizing Provider  amoxicillin (AMOXIL) 500 MG tablet Take 1 tablet (500 mg total) by mouth 3 (three) times daily. 03/09/17   Cylis Ayars, Rulon Eisenmenger B, FNP  Cholecalciferol (VITAMIN D3) 5000 units CAPS Take 1 capsule (5,000 Units total) by mouth daily. Patient not taking: Reported on 07/16/2015 06/02/15   Shambley, Melody N, CNM  docusate sodium (COLACE) 100 MG capsule Take 1 capsule (100 mg total) by mouth 2 (two) times daily as needed. Patient not taking: Reported on 06/08/2015 06/02/15   Shambley, Melody N, CNM  guaiFENesin-codeine 100-10 MG/5ML syrup Take 10 mLs by mouth 3 (three) times daily as needed. 03/09/17   Estel Tonelli, Rulon Eisenmenger B, FNP  ibuprofen (ADVIL,MOTRIN) 800 MG tablet Take 1 tablet (800 mg total) by mouth every 6 (six) hours. Patient not  taking: Reported on 07/16/2015 06/02/15   Shambley, Melody N, CNM  Iron-FA-B Cmp-C-Biot-Probiotic (FUSION PLUS) CAPS Take 1 capsule by mouth daily. Patient not taking: Reported on 07/16/2015 06/02/15   Purcell Nails, CNM  oxyCODONE-acetaminophen (PERCOCET/ROXICET) 5-325 MG tablet Take 2 tablets by mouth every 4 (four) hours as needed (pain scale > 7). Patient not taking: Reported on 07/16/2015 06/02/15   Purcell Nails, CNM  Prenatal Vit-Fe Fumarate-FA (PRENATAL MULTIVITAMIN) TABS tablet Take 1 tablet by mouth daily at 12 noon. Reported on 07/16/2015    [provider]  promethazine (PHENERGAN) 12.5 MG tablet Take 1 tablet (12.5 mg total) by mouth every 6 (six) hours as needed for nausea or vomiting. 11/29/16   Willy Eddy, MD  sertraline (ZOLOFT) 50 MG tablet Take 1 tablet (50 mg total) by mouth daily. 08/18/15   Purcell Nails, CNM    Allergies Patient has no known allergies.  Family History  Problem Relation Age of Onset  . Asthma Mother   . Migraines Mother   . Cancer Maternal Grandmother        mat. great grandmother/unsure of what kind  . Breast cancer Maternal Aunt        Great  . Diabetes Other        mat great grandmother  . Heart failure Other        mat great grandmother    Social History Social History   Tobacco Use  . Smoking status: Never Smoker  . Smokeless tobacco: Never Used  Substance Use  Topics  . Alcohol use: No    Alcohol/week: 0.0 oz  . Drug use: No    Review of Systems Constitutional: Positive for fever/chills ENT: Negative for sore throat. Cardiovascular: Denies chest pain. Respiratory: Negative for shortness of breath.  Positive for cough. Gastrointestinal: Negative for nausea, no vomiting.  No diarrhea.  Musculoskeletal: Positive for body aches Skin: Negative for rash. Neurological: Negative for headaches ____________________________________________   PHYSICAL EXAM:  VITAL SIGNS: ED Triage Vitals  Enc Vitals Group      BP --      Pulse Rate 03/09/17 1127 90     Resp 03/09/17 1127 16     Temp 03/09/17 1127 98.2 F (36.8 C)     Temp Source 03/09/17 1127 Oral     SpO2 03/09/17 1127 99 %     Weight 03/09/17 1121 253 lb (114.8 kg)     Height 03/09/17 1121 5\' 5"  (1.651 m)     Head Circumference --      Peak Flow --      Pain Score 03/09/17 1121 5     Pain Loc --      Pain Edu? --      Excl. in GC? --     Constitutional: Alert and oriented.  Acutely ill appearing and in no acute distress. Eyes: Conjunctivae are normal. EOMI. Ears: Bilateral tympanic membranes appear normal. Nose: Sinus congestion noted; no rhinnorhea. Mouth/Throat: Mucous membranes are moist.  Oropharynx normal. Tonsils not visualized. Neck: No stridor.  Lymphatic: No cervical lymphadenopathy. Cardiovascular: Normal rate, regular rhythm. Good peripheral circulation. Respiratory: Normal respiratory effort.  No retractions.  Breath sounds diminished throughout. Gastrointestinal: Soft and nontender.  Musculoskeletal: FROM x 4 extremities.  Neurologic:  Normal speech and language.  Skin:  Skin is warm, dry and intact. No rash noted. Psychiatric: Mood and affect are normal. Speech and behavior are normal.  ____________________________________________   LABS (all labs ordered are listed, but only abnormal results are displayed)  Labs Reviewed - No data to display ____________________________________________  EKG  Not indicated ____________________________________________  RADIOLOGY  Not indicated ____________________________________________   PROCEDURES  Procedure(s) performed: None  Critical Care performed: No ____________________________________________   INITIAL IMPRESSION / ASSESSMENT AND PLAN / ED COURSE  27 year old female presenting to the emergency department for 6-day history of upper respiratory infection and sinus congestion.  She will be treated with amoxicillin and guaifenesin with codeine.  She will  be advised to follow-up with her primary care provider for symptoms that are not improving over the next few days.  She is to return to the emergency department for symptoms of change or worsen if unable to schedule an appointment.  Medications - No data to display  ED Discharge Orders        Ordered    amoxicillin (AMOXIL) 500 MG tablet  3 times daily     03/09/17 1319    guaiFENesin-codeine 100-10 MG/5ML syrup  3 times daily PRN     03/09/17 1319      Pertinent labs & imaging results that were available during my care of the patient were reviewed by me and considered in my medical decision making (see chart for details).    If controlled substance prescribed during this visit, 12 month history viewed on the [facility] prior to issuing an initial prescription for Schedule II or III opiod. ____________________________________________   FINAL CLINICAL IMPRESSION(S) / ED DIAGNOSES  Final diagnoses:  Upper respiratory tract infection, unspecified type  Acute non-recurrent maxillary sinusitis  Note:  This document was prepared using Dragon voice recognition software and may include unintentional dictation errors.     Chinita Pester, FNP 03/09/17 1320    Minna Antis, MD 03/09/17 1437

## 2017-04-05 ENCOUNTER — Encounter: Payer: Self-pay | Admitting: Obstetrics and Gynecology

## 2017-05-11 ENCOUNTER — Encounter: Payer: Self-pay | Admitting: Emergency Medicine

## 2017-05-11 ENCOUNTER — Emergency Department: Payer: Self-pay

## 2017-05-11 ENCOUNTER — Emergency Department
Admission: EM | Admit: 2017-05-11 | Discharge: 2017-05-11 | Disposition: A | Discharge status: Home / Self Care | Payer: Self-pay | Attending: Emergency Medicine | Admitting: Emergency Medicine

## 2017-05-11 DIAGNOSIS — E785 Hyperlipidemia, unspecified: Secondary | ICD-10-CM | POA: Insufficient documentation

## 2017-05-11 DIAGNOSIS — R1030 Lower abdominal pain, unspecified: Secondary | ICD-10-CM | POA: Insufficient documentation

## 2017-05-11 DIAGNOSIS — R110 Nausea: Secondary | ICD-10-CM | POA: Insufficient documentation

## 2017-05-11 LAB — URINALYSIS, COMPLETE (UACMP) WITH MICROSCOPIC
Bacteria, UA: NONE SEEN
Bilirubin Urine: NEGATIVE
Glucose, UA: NEGATIVE mg/dL
Hgb urine dipstick: NEGATIVE
Ketones, ur: NEGATIVE mg/dL
Leukocytes, UA: NEGATIVE
Nitrite: NEGATIVE
Protein, ur: NEGATIVE mg/dL
Specific Gravity, Urine: 1.015 (ref 1.005–1.030)
pH: 6 (ref 5.0–8.0)

## 2017-05-11 LAB — COMPREHENSIVE METABOLIC PANEL
ALT: 23 U/L (ref 14–54)
AST: 24 U/L (ref 15–41)
Albumin: 3.5 g/dL (ref 3.5–5.0)
Alkaline Phosphatase: 53 U/L (ref 38–126)
Anion gap: 9 (ref 5–15)
BUN: 8 mg/dL (ref 6–20)
CO2: 22 mmol/L (ref 22–32)
Calcium: 8 mg/dL — ABNORMAL LOW (ref 8.9–10.3)
Chloride: 106 mmol/L (ref 101–111)
Creatinine, Ser: 0.73 mg/dL (ref 0.44–1.00)
GFR calc Af Amer: >60 mL/min (ref >60)
GFR calc non Af Amer: >60 mL/min (ref >60)
Glucose, Bld: 104 mg/dL — ABNORMAL HIGH (ref 65–99)
Potassium: 3.2 mmol/L — ABNORMAL LOW (ref 3.5–5.1)
Sodium: 137 mmol/L (ref 135–145)
Total Bilirubin: 0.8 mg/dL (ref 0.3–1.2)
Total Protein: 6.4 g/dL — ABNORMAL LOW (ref 6.5–8.1)

## 2017-05-11 LAB — CBC
HCT: 39.5 % (ref 35.0–47.0)
Hemoglobin: 13.3 g/dL (ref 12.0–16.0)
MCH: 27.7 pg (ref 26.0–34.0)
MCHC: 33.8 g/dL (ref 32.0–36.0)
MCV: 82 fL (ref 80.0–100.0)
Platelets: 195 10*3/uL (ref 150–440)
RBC: 4.81 MIL/uL (ref 3.80–5.20)
RDW: 13.9 % (ref 11.5–14.5)
WBC: 5.4 10*3/uL (ref 3.6–11.0)

## 2017-05-11 LAB — POCT PREGNANCY, URINE: Preg Test, Ur: NEGATIVE

## 2017-05-11 LAB — LIPASE, BLOOD: Lipase: 28 U/L (ref 11–51)

## 2017-05-11 MED ORDER — ONDANSETRON 4 MG PO TBDP: 4.0000 mg | ORAL_TABLET | Freq: Three times a day (TID) | ORAL | 0 refills | Status: DC | PRN

## 2017-05-11 MED ORDER — ONDANSETRON 4 MG PO TBDP
4.0000 mg | ORAL_TABLET | Freq: Once | ORAL | Status: AC
  Administered 2017-05-11: 4 mg via ORAL
  Filled 2017-05-11: qty 1

## 2017-05-11 MED ORDER — DICYCLOMINE HCL 20 MG PO TABS: 20.0000 mg | ORAL_TABLET | Freq: Three times a day (TID) | ORAL | 0 refills | Status: DC | PRN

## 2017-05-11 MED ORDER — OXYCODONE-ACETAMINOPHEN 5-325 MG PO TABS
2.0000 | ORAL_TABLET | Freq: Once | ORAL | Status: AC
  Administered 2017-05-11: 2 via ORAL
  Filled 2017-05-11: qty 2

## 2017-05-11 NOTE — ED Triage Notes (Signed)
Patient presents to ED via POV from home with c/o abdominal pain, nausea and constipation. Patient reports yesterday she had diarrhea but today she is constipated. Patient denies taking any antidiarrheals.

## 2017-05-11 NOTE — ED Provider Notes (Signed)
[facility] Emergency Department Provider Note       Time seen: ----------------------------------------- 2:30 PM on 05/11/2017 -----------------------------------------   I have reviewed the triage vital signs and the nursing notes.  HISTORY   Chief Complaint Abdominal Pain    HPI Sophia Berry is a 27 y.o. female with a history of anemia, GERD, hyperlipidemia, migraines who presents to the ED for planes of abdominal pain with nausea and possible constipation although she has had diarrhea.  Patient reports yesterday she had diarrhea but today she thinks she is constipated, she has a poor appetite and she denies taking anything for the diarrhea.  She denies fevers, chills or other complaints.  Past Medical History:  Diagnosis Date  . Anemia   . GERD (gastroesophageal reflux disease)    with preg.  Marland Kitchen. Hyperlipidemia   . Migraines   . Shortness of breath dyspnea     Patient Active Problem List   Diagnosis Date Noted  . S/P cesarean section 05/31/2015    Past Surgical History:  Procedure Laterality Date  . CESAREAN SECTION    . CESAREAN SECTION WITH BILATERAL TUBAL LIGATION N/A 05/31/2015   Procedure: REPEAT CESAREAN SECTION WITH BILATERAL TUBAL LIGATION;  Surgeon: Hildred LaserAnika Cherry, MD;  Location: [facility] ORS;  Service: Obstetrics;  Laterality: N/A;    Allergies Patient has no known allergies.  Social History Social History   Tobacco Use  . Smoking status: Never Smoker  . Smokeless tobacco: Never Used  Substance Use Topics  . Alcohol use: No    Alcohol/week: 0.0 oz  . Drug use: No    Review of Systems Constitutional: Negative for fever. Cardiovascular: Negative for chest pain. Respiratory: Negative for shortness of breath. Gastrointestinal: Positive for abdominal pain, nausea and diarrhea Genitourinary: Negative for dysuria. Musculoskeletal: Negative for back pain. Skin: Negative for rash. Neurological: Negative for headaches, focal  weakness or numbness.  All systems negative/normal/unremarkable except as stated in the HPI  ____________________________________________   PHYSICAL EXAM:  VITAL SIGNS: ED Triage Vitals [05/11/17 0901]  Enc Vitals Group     BP 119/89     Pulse Rate (!) 103     Resp 16     Temp 98.1 F (36.7 C)     Temp Source Oral     SpO2 99 %     Weight 240 lb (108.9 kg)     Height 5\' 5"  (1.651 m)     Head Circumference      Peak Flow      Pain Score 7     Pain Loc      Pain Edu?      Excl. in GC?    Constitutional: Alert and oriented. Well appearing and in no distress. Eyes: Conjunctivae are normal. Normal extraocular movements. ENT   Head: Normocephalic and atraumatic.   Nose: No congestion/rhinnorhea.   Mouth/Throat: Mucous membranes are moist.   Neck: No stridor. Cardiovascular: Normal rate, regular rhythm. No murmurs, rubs, or gallops. Respiratory: Normal respiratory effort without tachypnea nor retractions. Breath sounds are clear and equal bilaterally. No wheezes/rales/rhonchi. Gastrointestinal: Nonfocal tenderness, no rebound or guarding.  Normal bowel sounds. Musculoskeletal: Nontender with normal range of motion in extremities. No lower extremity tenderness nor edema. Neurologic:  Normal speech and language. No gross focal neurologic deficits are appreciated.  Skin:  Skin is warm, dry and intact. No rash noted. Psychiatric: Mood and affect are normal. Speech and behavior are normal.  ____________________________________________  ED COURSE:  As part of my medical  decision making, I reviewed the following data within the electronic MEDICAL RECORD NUMBER History obtained from family if available, nursing notes, old chart and ekg, as well as notes from prior ED visits. Patient presented for abdominal pain with nausea and possible constipation, we will assess with labs and imaging as indicated at this time.   Procedures ____________________________________________    LABS (pertinent positives/negatives)  Labs Reviewed  COMPREHENSIVE METABOLIC PANEL - Abnormal; Notable for the following components:      Result Value   Potassium 3.2 (*)    Glucose, Bld 104 (*)    Calcium 8.0 (*)    Total Protein 6.4 (*)    All other components within normal limits  URINALYSIS, COMPLETE (UACMP) WITH MICROSCOPIC - Abnormal; Notable for the following components:   Color, Urine YELLOW (*)    APPearance HAZY (*)    Squamous Epithelial / LPF 0-5 (*)    All other components within normal limits  LIPASE, BLOOD  CBC  POCT PREGNANCY, URINE  POC URINE PREG, ED    RADIOLOGY  Abdomen 2 view is negative  ____________________________________________  DIFFERENTIAL DIAGNOSIS   Gastroenteritis, constipation, dehydration, electrolyte abnormality, pregnancy  FINAL ASSESSMENT AND PLAN  Abdominal pain, nausea   Plan: The patient had presented for dental pain with nausea and recent diarrhea.  This likely reflects a viral etiology.  Patient's labs are reassuring. Patient's imaging is also reassuring.  Patient be discharged with Zofran and Bentyl.  She is stable for outpatient follow-up.   Ulice Dash, MD   Note: This note was generated in part or whole with voice recognition software. Voice recognition is usually quite accurate but there are transcription errors that can and very often do occur. I apologize for any typographical errors that were not detected and corrected.     Emily Filbert, MD 05/11/17 [phone]

## 2017-05-11 NOTE — ED Notes (Signed)
Pt presents today after 2 days of dry heaves. Pt states she did have diarrhea but that is gone. Pt states she had a fever but didn't check it. Pt is NAD and VSS.

## 2017-08-15 ENCOUNTER — Other Ambulatory Visit: Payer: Self-pay

## 2017-08-15 ENCOUNTER — Emergency Department
Admission: EM | Admit: 2017-08-15 | Discharge: 2017-08-15 | Disposition: A | Discharge status: Home / Self Care | Payer: Medicaid Other | Attending: Emergency Medicine | Admitting: Emergency Medicine

## 2017-08-15 DIAGNOSIS — R519 Headache, unspecified: Secondary | ICD-10-CM

## 2017-08-15 DIAGNOSIS — R51 Headache: Secondary | ICD-10-CM | POA: Insufficient documentation

## 2017-08-15 LAB — URINALYSIS, COMPLETE (UACMP) WITH MICROSCOPIC
Bilirubin Urine: NEGATIVE
Glucose, UA: NEGATIVE mg/dL
Hgb urine dipstick: NEGATIVE
Ketones, ur: NEGATIVE mg/dL
Leukocytes, UA: NEGATIVE
Nitrite: NEGATIVE
Protein, ur: NEGATIVE mg/dL
Specific Gravity, Urine: 1.026 (ref 1.005–1.030)
pH: 6 (ref 5.0–8.0)

## 2017-08-15 LAB — POCT PREGNANCY, URINE: Preg Test, Ur: NEGATIVE

## 2017-08-15 MED ORDER — SODIUM CHLORIDE 0.9 % IV BOLUS
1000.0000 mL | Freq: Once | INTRAVENOUS | Status: AC
  Administered 2017-08-15: 1000 mL via INTRAVENOUS

## 2017-08-15 MED ORDER — METOCLOPRAMIDE HCL 5 MG/ML IJ SOLN
10.0000 mg | Freq: Once | INTRAMUSCULAR | Status: AC
  Administered 2017-08-15: 10 mg via INTRAVENOUS
  Filled 2017-08-15: qty 2

## 2017-08-15 MED ORDER — DIPHENHYDRAMINE HCL 50 MG/ML IJ SOLN
12.5000 mg | Freq: Once | INTRAMUSCULAR | Status: AC
  Administered 2017-08-15: 12.5 mg via INTRAVENOUS
  Filled 2017-08-15: qty 1

## 2017-08-15 MED ORDER — BUTALBITAL-APAP-CAFFEINE 50-325-40 MG PO TABS: 1.0000 | ORAL_TABLET | Freq: Four times a day (QID) | ORAL | 0 refills | Status: AC | PRN

## 2017-08-15 MED ORDER — KETOROLAC TROMETHAMINE 30 MG/ML IJ SOLN
15.0000 mg | Freq: Once | INTRAMUSCULAR | Status: AC
  Administered 2017-08-15: 15 mg via INTRAVENOUS
  Filled 2017-08-15: qty 1

## 2017-08-15 NOTE — ED Provider Notes (Signed)
[facility] Emergency Department Provider Note  ____________________________________________  Time seen: Approximately 8:38 PM  I have reviewed the triage vital signs and the nursing notes.   HISTORY  Chief Complaint Headache   HPI Sophia Berry is a 27 y.o. female history of migraine headaches who presents for evaluation of a headache.  Patient reports headache started 2 days ago while she was at work.  The headache did not start as thunderclap or during exertion.  The headache is located on the right side of her head, sharp, constant and nonradiating, currently 8/10. She tried alleve at home with no relief. HA is similar to prior chronic HAs. Initially was mild and got progressively worse over the course of the day.  Patient endorses nausea but no vomiting.  She endorses photophobia.  She denies changes in vision, dizziness, chest pain or shortness of breath, neck stiffness, fever or chills.  She denies unilateral weakness or numbness, facial droop, slurred speech, difficulty finding words, gait instability.  She is not on any birth control pill.  Past Medical History:  Diagnosis Date  . Anemia   . GERD (gastroesophageal reflux disease)    with preg.  Marland Kitchen Hyperlipidemia   . Migraines   . Shortness of breath dyspnea     Patient Active Problem List   Diagnosis Date Noted  . S/P cesarean section 05/31/2015    Past Surgical History:  Procedure Laterality Date  . CESAREAN SECTION    . CESAREAN SECTION WITH BILATERAL TUBAL LIGATION N/A 05/31/2015   Procedure: REPEAT CESAREAN SECTION WITH BILATERAL TUBAL LIGATION;  Surgeon: Hildred Laser, MD;  Location: [facility] ORS;  Service: Obstetrics;  Laterality: N/A;    Prior to Admission medications   Medication Sig Start Date End Date Taking? Authorizing Provider  amoxicillin (AMOXIL) 500 MG tablet Take 1 tablet (500 mg total) by mouth 3 (three) times daily. 03/09/17   Chinita Pester, FNP    butalbital-acetaminophen-caffeine (FIORICET, ESGIC) [phone] MG tablet Take 1 tablet by mouth every 6 (six) hours as needed for headache. 08/15/17 08/15/18  Nita Sickle, MD  Cholecalciferol (VITAMIN D3) 5000 units CAPS Take 1 capsule (5,000 Units total) by mouth daily. Patient not taking: Reported on 07/16/2015 06/02/15   Shambley, Melody N, CNM  dicyclomine (BENTYL) 20 MG tablet Take 1 tablet (20 mg total) by mouth 3 (three) times daily as needed for spasms. 05/11/17   Emily Filbert, MD  docusate sodium (COLACE) 100 MG capsule Take 1 capsule (100 mg total) by mouth 2 (two) times daily as needed. Patient not taking: Reported on 06/08/2015 06/02/15   Shambley, Melody N, CNM  guaiFENesin-codeine 100-10 MG/5ML syrup Take 10 mLs by mouth 3 (three) times daily as needed. 03/09/17   Triplett, Rulon Eisenmenger B, FNP  ibuprofen (ADVIL,MOTRIN) 800 MG tablet Take 1 tablet (800 mg total) by mouth every 6 (six) hours. Patient not taking: Reported on 07/16/2015 06/02/15   Shambley, Melody N, CNM  Iron-FA-B Cmp-C-Biot-Probiotic (FUSION PLUS) CAPS Take 1 capsule by mouth daily. Patient not taking: Reported on 07/16/2015 06/02/15   Shambley, Melody N, CNM  ondansetron (ZOFRAN ODT) 4 MG disintegrating tablet Take 1 tablet (4 mg total) by mouth every 8 (eight) hours as needed for nausea or vomiting. 05/11/17   Emily Filbert, MD  oxyCODONE-acetaminophen (PERCOCET/ROXICET) 5-325 MG tablet Take 2 tablets by mouth every 4 (four) hours as needed (pain scale > 7). Patient not taking: Reported on 07/16/2015 06/02/15   Purcell Nails, CNM  Prenatal Vit-Fe  Fumarate-FA (PRENATAL MULTIVITAMIN) TABS tablet Take 1 tablet by mouth daily at 12 noon. Reported on 07/16/2015    [provider]  promethazine (PHENERGAN) 12.5 MG tablet Take 1 tablet (12.5 mg total) by mouth every 6 (six) hours as needed for nausea or vomiting. 11/29/16   Willy Eddyobinson, Patrick, MD  sertraline (ZOLOFT) 50 MG tablet Take 1 tablet (50 mg total) by  mouth daily. 08/18/15   Purcell NailsShambley, Melody N, CNM    Allergies Patient has no known allergies.  Family History  Problem Relation Age of Onset  . Asthma Mother   . Migraines Mother   . Cancer Maternal Grandmother        mat. great grandmother/unsure of what kind  . Breast cancer Maternal Aunt        Great  . Diabetes Other        mat great grandmother  . Heart failure Other        mat great grandmother    Social History Social History   Tobacco Use  . Smoking status: Never Smoker  . Smokeless tobacco: Never Used  Substance Use Topics  . Alcohol use: No    Alcohol/week: 0.0 oz  . Drug use: No    Review of Systems  Constitutional: Negative for fever. Eyes: Negative for visual changes. ENT: Negative for sore throat. Neck: No neck pain  Cardiovascular: Negative for chest pain. Respiratory: Negative for shortness of breath. Gastrointestinal: Negative for abdominal pain, vomiting or diarrhea. Genitourinary: Negative for dysuria. Musculoskeletal: Negative for back pain. Skin: Negative for rash. Neurological: Negative for weakness or numbness. + HA and photophobia Psych: No SI or HI  ____________________________________________   PHYSICAL EXAM:  VITAL SIGNS: ED Triage Vitals  Enc Vitals Group     BP 08/15/17 1841 114/61     Pulse Rate 08/15/17 1841 92     Resp 08/15/17 1841 16     Temp 08/15/17 1841 98.3 F (36.8 C)     Temp Source 08/15/17 1841 Oral     SpO2 08/15/17 1841 99 %     Weight 08/15/17 1843 248 lb (112.5 kg)     Height 08/15/17 1843 5\' 5"  (1.651 m)     Head Circumference --      Peak Flow --      Pain Score 08/15/17 1843 7     Pain Loc --      Pain Edu? --      Excl. in GC? --     Constitutional: Alert and oriented. Well appearing and in no apparent distress. HEENT:      Head: Normocephalic and atraumatic.         Eyes: Conjunctivae are normal. Sclera is non-icteric.       Mouth/Throat: Mucous membranes are moist.       Neck: Supple with no  signs of meningismus. Cardiovascular: Regular rate and rhythm. No murmurs, gallops, or rubs. 2+ symmetrical distal pulses are present in all extremities. No JVD. Respiratory: Normal respiratory effort. Lungs are clear to auscultation bilaterally. No wheezes, crackles, or rhonchi.  Gastrointestinal: Soft, non tender, and non distended with positive bowel sounds. No rebound or guarding. Musculoskeletal: Nontender with normal range of motion in all extremities. No edema, cyanosis, or erythema of extremities. Neurologic: Normal speech and language. A & O x3, PERRL, EOMI, no nystagmus, CN II-XII intact, motor testing reveals good tone and bulk throughout. There is no evidence of pronator drift or dysmetria. Muscle strength is 5/5 throughout.Sensory examination is intact. Gait is normal.  Skin: Skin is warm, dry and intact. No rash noted. Psychiatric: Mood and affect are normal. Speech and behavior are normal.  ____________________________________________   LABS (all labs ordered are listed, but only abnormal results are displayed)  Labs Reviewed  URINALYSIS, COMPLETE (UACMP) WITH MICROSCOPIC - Abnormal; Notable for the following components:      Result Value   Color, Urine YELLOW (*)    APPearance CLOUDY (*)    Bacteria, UA RARE (*)    All other components within normal limits  POCT PREGNANCY, URINE   ____________________________________________  EKG  none  ____________________________________________  RADIOLOGY  none  ____________________________________________   PROCEDURES  Procedure(s) performed: None Procedures Critical Care performed:  None ____________________________________________   INITIAL IMPRESSION / ASSESSMENT AND PLAN / ED COURSE  27 y.o. female history of migraine headaches who presents for evaluation of a headache.  Low suspicion for more serious or life threatening etiology of HA based on history and exam. No sudden onset thunderclap HA, onset with  exertion, vomiting, focal neurologic deficits, to suggest increased risk of subarachnoid hemorrhage. No fever, neck pain, neck stiffness, or meningismus on exam to suggest meningitis. No fevers, altered mental status, unusual behavior to suggest encephalitis. No focal neurologic deficits by history or exam to suggest central venous thrombosis. No constitutional symptoms including fever, fatigue, weight loss, temporal scalp tenderness, jaw claudication, visual loss, to suggest temporal arteritis. No immunocompromise to suggest increased risk for intracranial infectious disease. No visual changes or findings on ocular exam to suggest acute angle closure glaucoma. No reports of toxic exposures including carbon monoxide or other household members with similar symptoms.  Will check pregnancy and treat with migraine cocktail.    _________________________ 10:10 PM on 08/15/2017 -----------------------------------------  Headache is fully resolved.  Patient remains neurologically intact and well-appearing.  Will provide a prescription for Fioricet since these headaches seem to be frequent for patient.  Will discharge home with follow-up with primary care doctor.  Discussed return precautions.   As part of my medical decision making, I reviewed the following data within the electronic MEDICAL RECORD NUMBER Nursing notes reviewed and incorporated, Labs reviewed , Notes from prior ED visits and [place] Controlled Substance Database    Pertinent labs & imaging results that were available during my care of the patient were reviewed by me and considered in my medical decision making (see chart for details).    ____________________________________________   FINAL CLINICAL IMPRESSION(S) / ED DIAGNOSES  Final diagnoses:  Acute nonintractable headache, unspecified headache type      NEW MEDICATIONS STARTED DURING THIS VISIT:  ED Discharge Orders        Ordered    butalbital-acetaminophen-caffeine (FIORICET,  ESGIC) 50-325-40 MG tablet  Every 6 hours PRN     08/15/17 2209       Note:  This document was prepared using Dragon voice recognition software and may include unintentional dictation errors.    Don Perking, [place], MD 08/15/17 [phone]

## 2017-08-15 NOTE — ED Triage Notes (Signed)
Pt to the er for headache and dizziness. Nausea but nmo vomiting. Pt says she is able to tolerate PO. Took alieve at home with some relief.

## 2017-08-15 NOTE — Discharge Instructions (Addendum)

## 2017-08-29 ENCOUNTER — Emergency Department
Admission: EM | Admit: 2017-08-29 | Discharge: 2017-08-29 | Disposition: A | Discharge status: Home / Self Care | Payer: Medicaid Other | Attending: Emergency Medicine | Admitting: Emergency Medicine

## 2017-08-29 ENCOUNTER — Other Ambulatory Visit: Payer: Self-pay

## 2017-08-29 DIAGNOSIS — E860 Dehydration: Secondary | ICD-10-CM | POA: Insufficient documentation

## 2017-08-29 DIAGNOSIS — Z79899 Other long term (current) drug therapy: Secondary | ICD-10-CM | POA: Insufficient documentation

## 2017-08-29 DIAGNOSIS — R531 Weakness: Secondary | ICD-10-CM

## 2017-08-29 LAB — BASIC METABOLIC PANEL
Anion gap: 5 (ref 5–15)
BUN: 11 mg/dL (ref 6–20)
CO2: 25 mmol/L (ref 22–32)
Calcium: 8.1 mg/dL — ABNORMAL LOW (ref 8.9–10.3)
Chloride: 111 mmol/L (ref 98–111)
Creatinine, Ser: 0.78 mg/dL (ref 0.44–1.00)
GFR calc Af Amer: >60 mL/min (ref >60)
GFR calc non Af Amer: >60 mL/min (ref >60)
Glucose, Bld: 128 mg/dL — ABNORMAL HIGH (ref 70–99)
Potassium: 3.9 mmol/L (ref 3.5–5.1)
Sodium: 141 mmol/L (ref 135–145)

## 2017-08-29 LAB — URINALYSIS, COMPLETE (UACMP) WITH MICROSCOPIC
Bilirubin Urine: NEGATIVE
Glucose, UA: NEGATIVE mg/dL
Hgb urine dipstick: NEGATIVE
Ketones, ur: NEGATIVE mg/dL
Leukocytes, UA: NEGATIVE
Nitrite: NEGATIVE
Protein, ur: NEGATIVE mg/dL
Specific Gravity, Urine: 1.028 (ref 1.005–1.030)
pH: 5 (ref 5.0–8.0)

## 2017-08-29 LAB — HCG, QUANTITATIVE, PREGNANCY: hCG, Beta Chain, Quant, S: <1 m[IU]/mL (ref <5)

## 2017-08-29 LAB — CBC
HCT: 41.3 % (ref 35.0–47.0)
Hemoglobin: 14.2 g/dL (ref 12.0–16.0)
MCH: 28.9 pg (ref 26.0–34.0)
MCHC: 34.3 g/dL (ref 32.0–36.0)
MCV: 84.3 fL (ref 80.0–100.0)
Platelets: 236 10*3/uL (ref 150–440)
RBC: 4.91 MIL/uL (ref 3.80–5.20)
RDW: 14.7 % — ABNORMAL HIGH (ref 11.5–14.5)
WBC: 7.3 10*3/uL (ref 3.6–11.0)

## 2017-08-29 LAB — POCT PREGNANCY, URINE
Preg Test, Ur: NEGATIVE
Preg Test, Ur: NEGATIVE

## 2017-08-29 NOTE — Discharge Instructions (Signed)
Please drink plenty of fluids.  If you has more diarrhea.  Please return here for worse pain fever unable to keep down fluids or if the diarrhea gets very bad.  Follow-up with your doctor in the next

## 2017-08-29 NOTE — ED Provider Notes (Addendum)
[facility] Emergency Department Provider Note   ____________________________________________   First MD Initiated Contact with Patient 08/29/17 1650     (approximate)  I have reviewed the triage vital signs and the nursing notes.   HISTORY  Chief Complaint Weakness    HPI Sophia Berry is a 27 y.o. female who complains of feeling weak today she has a mild headache and began having diarrhea and nausea this morning.  Diarrhea seems to be stopped.  Nausea is somewhat better but she still feels very weak and tired and ill.  Headache is not severe.  Last diarrhea was this morning.  Past Medical History:  Diagnosis Date  . Anemia   . GERD (gastroesophageal reflux disease)    with preg.  Marland Kitchen. Hyperlipidemia   . Migraines   . Shortness of breath dyspnea     Patient Active Problem List   Diagnosis Date Noted  . S/P cesarean section 05/31/2015    Past Surgical History:  Procedure Laterality Date  . CESAREAN SECTION    . CESAREAN SECTION WITH BILATERAL TUBAL LIGATION N/A 05/31/2015   Procedure: REPEAT CESAREAN SECTION WITH BILATERAL TUBAL LIGATION;  Surgeon: Hildred LaserAnika Cherry, MD;  Location: [facility] ORS;  Service: Obstetrics;  Laterality: N/A;    Prior to Admission medications   Medication Sig Start Date End Date Taking? Authorizing Provider  butalbital-acetaminophen-caffeine (FIORICET, ESGIC) [phone] MG tablet Take 1 tablet by mouth every 6 (six) hours as needed for headache. 08/15/17 08/15/18 Yes Don PerkingVeronese, [place], MD  Cholecalciferol (VITAMIN D3) 5000 units CAPS Take 1 capsule (5,000 Units total) by mouth daily. Patient not taking: Reported on 07/16/2015 06/02/15   Shambley, Melody N, CNM  dicyclomine (BENTYL) 20 MG tablet Take 1 tablet (20 mg total) by mouth 3 (three) times daily as needed for spasms. Patient not taking: Reported on 08/29/2017 05/11/17   Emily FilbertWilliams, Jonathan E, MD  docusate sodium (COLACE) 100 MG capsule Take 1 capsule (100 mg total) by mouth  2 (two) times daily as needed. Patient not taking: Reported on 06/08/2015 06/02/15   Shambley, Melody N, CNM  guaiFENesin-codeine 100-10 MG/5ML syrup Take 10 mLs by mouth 3 (three) times daily as needed. Patient not taking: Reported on 08/29/2017 03/09/17   Kem Boroughsriplett, Cari B, FNP  ibuprofen (ADVIL,MOTRIN) 800 MG tablet Take 1 tablet (800 mg total) by mouth every 6 (six) hours. Patient not taking: Reported on 07/16/2015 06/02/15   Shambley, Melody N, CNM  Iron-FA-B Cmp-C-Biot-Probiotic (FUSION PLUS) CAPS Take 1 capsule by mouth daily. Patient not taking: Reported on 07/16/2015 06/02/15   Shambley, Melody N, CNM  ondansetron (ZOFRAN ODT) 4 MG disintegrating tablet Take 1 tablet (4 mg total) by mouth every 8 (eight) hours as needed for nausea or vomiting. Patient not taking: Reported on 08/29/2017 05/11/17   Emily FilbertWilliams, Jonathan E, MD  oxyCODONE-acetaminophen (PERCOCET/ROXICET) 5-325 MG tablet Take 2 tablets by mouth every 4 (four) hours as needed (pain scale > 7). Patient not taking: Reported on 07/16/2015 06/02/15   Purcell NailsShambley, Melody N, CNM  promethazine (PHENERGAN) 12.5 MG tablet Take 1 tablet (12.5 mg total) by mouth every 6 (six) hours as needed for nausea or vomiting. Patient not taking: Reported on 08/29/2017 11/29/16   Willy Eddyobinson, Patrick, MD  sertraline (ZOLOFT) 50 MG tablet Take 1 tablet (50 mg total) by mouth daily. Patient not taking: Reported on 08/29/2017 08/18/15   Purcell NailsShambley, Melody N, CNM    Allergies Patient has no known allergies.  Family History  Problem Relation Age of Onset  .  Asthma Mother   . Migraines Mother   . Cancer Maternal Grandmother        mat. great grandmother/unsure of what kind  . Breast cancer Maternal Aunt        Great  . Diabetes Other        mat great grandmother  . Heart failure Other        mat great grandmother    Social History Social History   Tobacco Use  . Smoking status: Never Smoker  . Smokeless tobacco: Never Used  Substance Use Topics  . Alcohol use:  No    Alcohol/week: 0.0 oz  . Drug use: No    Review of Systems  Constitutional: No fever/chills Eyes: No visual changes. ENT: No sore throat. Cardiovascular: Denies chest pain. Respiratory: Denies shortness of breath. Gastrointestinal: No abdominal pain.   nausea,  vomiting.   diarrhea.  No constipation. Genitourinary: Negative for dysuria. Musculoskeletal: Negative for back pain. Skin: Negative for rash. Neurological: Negative for headaches, focal weakness   ____________________________________________   PHYSICAL EXAM:  VITAL SIGNS: ED Triage Vitals [08/29/17 1238]  Enc Vitals Group     BP 117/68     Pulse Rate 86     Resp 14     Temp 98.5 F (36.9 C)     Temp Source Oral     SpO2 98 %     Weight 248 lb (112.5 kg)     Height 5\' 5"  (1.651 m)     Head Circumference      Peak Flow      Pain Score 3     Pain Loc      Pain Edu?      Excl. in GC?    Constitutional: Alert and oriented.  Pale appearing  in no acute distress. Eyes: Conjunctivae are normal.  Head: Atraumatic. Nose: No congestion/rhinnorhea. Mouth/Throat: Mucous membranes are moist.  Oropharynx non-erythematous. Neck: No stridor.  Cardiovascular: Normal rate, regular rhythm. Grossly normal heart sounds.  Good peripheral circulation. Respiratory: Normal respiratory effort.  No retractions. Lungs CTAB. Gastrointestinal: Soft and nontender. No distention. No abdominal bruits. No CVA tenderness. Musculoskeletal: No lower extremity tenderness nor edema.  No joint effusions. Neurologic:  Normal speech and language. No gross focal neurologic deficits are appreciated. Skin:  Skin is warm, dry and intact. No rash noted. Psychiatric: Mood and affect are normal. Speech and behavior are normal.  ____________________________________________   LABS (all labs ordered are listed, but only abnormal results are displayed)  Labs Reviewed  BASIC METABOLIC PANEL - Abnormal; Notable for the following components:       Result Value   Glucose, Bld 128 (*)    Calcium 8.1 (*)    All other components within normal limits  CBC - Abnormal; Notable for the following components:   RDW 14.7 (*)    All other components within normal limits  URINALYSIS, COMPLETE (UACMP) WITH MICROSCOPIC - Abnormal; Notable for the following components:   Color, Urine YELLOW (*)    APPearance CLOUDY (*)    Bacteria, UA MANY (*)    All other components within normal limits  HCG, QUANTITATIVE, PREGNANCY  POC URINE PREG, ED  POCT PREGNANCY, URINE  POCT PREGNANCY, URINE   ____________________________________________  EKG  ___EKG read interpreted by me shows normal sinus rhythm rate of 73 normal axis essentially normal EKG _________________________________________  RADIOLOGY  ED MD interpretation:   Official radiology report(s): No results found.  ____________________________________________   PROCEDURES  Procedure(s) performed:  Procedures  Critical Care performed:   ____________________________________________   INITIAL IMPRESSION / ASSESSMENT AND PLAN / ED COURSE  Patient has been waiting.  Have not cannot get IV fluids for her yet.  She wants to go home now.  Labs are okay she looks okay I will let her go.  We will encourage her to drink.      ____________________________________________   FINAL CLINICAL IMPRESSION(S) / ED DIAGNOSES  Final diagnoses:  Weakness  Dehydration     ED Discharge Orders    None       Note:  This document was prepared using Dragon voice recognition software and may include unintentional dictation errors.    Arnaldo Natal, MD 08/29/17 Ayesha Mohair    Arnaldo Natal, MD 09/11/17 [phone]

## 2017-08-29 NOTE — ED Triage Notes (Signed)
Generalized weakness beginning today. Mild headache. Pt alert and oriented X4, active, cooperative, pt in NAD. RR even and unlabored, color WNL.

## 2017-11-06 ENCOUNTER — Other Ambulatory Visit: Payer: Self-pay

## 2017-11-06 DIAGNOSIS — J069 Acute upper respiratory infection, unspecified: Secondary | ICD-10-CM | POA: Insufficient documentation

## 2017-11-06 DIAGNOSIS — Z79899 Other long term (current) drug therapy: Secondary | ICD-10-CM | POA: Insufficient documentation

## 2017-11-06 NOTE — ED Triage Notes (Signed)
Pt states since Saturday has had cold, congestion, bilat earaches, and cough.

## 2017-11-07 ENCOUNTER — Emergency Department
Admission: EM | Admit: 2017-11-07 | Discharge: 2017-11-07 | Disposition: A | Discharge status: Home / Self Care | Payer: Medicaid Other | Attending: Emergency Medicine | Admitting: Emergency Medicine

## 2017-11-07 DIAGNOSIS — J069 Acute upper respiratory infection, unspecified: Secondary | ICD-10-CM

## 2017-11-07 MED ORDER — AZITHROMYCIN 250 MG PO TABS: 250.0000 mg | ORAL_TABLET | Freq: Every day | ORAL | 0 refills | Status: DC

## 2017-11-07 MED ORDER — AZITHROMYCIN 500 MG PO TABS
500.0000 mg | ORAL_TABLET | Freq: Once | ORAL | Status: AC
  Administered 2017-11-07: 500 mg via ORAL
  Filled 2017-11-07: qty 1

## 2017-11-07 NOTE — ED Provider Notes (Signed)
[facility] Emergency Department Provider Note   ____________________________________________   First MD Initiated Contact with Patient 11/07/17 0225     (approximate)  I have reviewed the triage vital signs and the nursing notes.   HISTORY  Chief Complaint Nasal Congestion    HPI Sophia Berry is a 27 y.o. female who presents to the ED from home with a chief complaint of cold-like symptoms.  Patient reports a 4-day history of dry cough, nasal congestion, bilateral ear pain, sore throat.  Children are sick with similar symptoms.  Denies associated fever, chills, chest pain, shortness of breath, abdominal pain, nausea or vomiting.  Denies recent travel or trauma.   Past Medical History:  Diagnosis Date  . Anemia   . GERD (gastroesophageal reflux disease)    with preg.  Marland Kitchen. Hyperlipidemia   . Migraines   . Shortness of breath dyspnea     Patient Active Problem List   Diagnosis Date Noted  . S/P cesarean section 05/31/2015    Past Surgical History:  Procedure Laterality Date  . CESAREAN SECTION    . CESAREAN SECTION WITH BILATERAL TUBAL LIGATION N/A 05/31/2015   Procedure: REPEAT CESAREAN SECTION WITH BILATERAL TUBAL LIGATION;  Surgeon: Hildred LaserAnika Cherry, MD;  Location: [facility] ORS;  Service: Obstetrics;  Laterality: N/A;    Prior to Admission medications   Medication Sig Start Date End Date Taking? Authorizing Provider  azithromycin (ZITHROMAX) 250 MG tablet Take 1 tablet (250 mg total) by mouth daily. 11/07/17   Irean HongSung, Leandrew Keech J, MD  butalbital-acetaminophen-caffeine (FIORICET, ESGIC) [id number] MG tablet Take 1 tablet by mouth every 6 (six) hours as needed for headache. 08/15/17 08/15/18  Nita SickleVeronese, [place], MD  Cholecalciferol (VITAMIN D3) 5000 units CAPS Take 1 capsule (5,000 Units total) by mouth daily. Patient not taking: Reported on 07/16/2015 06/02/15   Shambley, Melody N, CNM  dicyclomine (BENTYL) 20 MG tablet Take 1 tablet (20 mg total) by mouth 3  (three) times daily as needed for spasms. Patient not taking: Reported on 08/29/2017 05/11/17   Emily FilbertWilliams, Jonathan E, MD  docusate sodium (COLACE) 100 MG capsule Take 1 capsule (100 mg total) by mouth 2 (two) times daily as needed. Patient not taking: Reported on 06/08/2015 06/02/15   Shambley, Melody N, CNM  guaiFENesin-codeine 100-10 MG/5ML syrup Take 10 mLs by mouth 3 (three) times daily as needed. Patient not taking: Reported on 08/29/2017 03/09/17   Kem Boroughsriplett, Cari B, FNP  ibuprofen (ADVIL,MOTRIN) 800 MG tablet Take 1 tablet (800 mg total) by mouth every 6 (six) hours. Patient not taking: Reported on 07/16/2015 06/02/15   Shambley, Melody N, CNM  Iron-FA-B Cmp-C-Biot-Probiotic (FUSION PLUS) CAPS Take 1 capsule by mouth daily. Patient not taking: Reported on 07/16/2015 06/02/15   Shambley, Melody N, CNM  ondansetron (ZOFRAN ODT) 4 MG disintegrating tablet Take 1 tablet (4 mg total) by mouth every 8 (eight) hours as needed for nausea or vomiting. Patient not taking: Reported on 08/29/2017 05/11/17   Emily FilbertWilliams, Jonathan E, MD  oxyCODONE-acetaminophen (PERCOCET/ROXICET) 5-325 MG tablet Take 2 tablets by mouth every 4 (four) hours as needed (pain scale > 7). Patient not taking: Reported on 07/16/2015 06/02/15   Purcell NailsShambley, Melody N, CNM  promethazine (PHENERGAN) 12.5 MG tablet Take 1 tablet (12.5 mg total) by mouth every 6 (six) hours as needed for nausea or vomiting. Patient not taking: Reported on 08/29/2017 11/29/16   Willy Eddyobinson, Patrick, MD  sertraline (ZOLOFT) 50 MG tablet Take 1 tablet (50 mg total) by mouth daily. Patient not  taking: Reported on 08/29/2017 08/18/15   Purcell Nails, CNM    Allergies Patient has no known allergies.  Family History  Problem Relation Age of Onset  . Asthma Mother   . Migraines Mother   . Cancer Maternal Grandmother        mat. great grandmother/unsure of what kind  . Breast cancer Maternal Aunt        Great  . Diabetes Other        mat great grandmother  . Heart  failure Other        mat great grandmother    Social History Social History   Tobacco Use  . Smoking status: Never Smoker  . Smokeless tobacco: Never Used  Substance Use Topics  . Alcohol use: No    Alcohol/week: 0.0 standard drinks  . Drug use: No    Review of Systems  Constitutional: No fever/chills Eyes: No visual changes. ENT: Positive for bilateral ear pain and sore throat. Cardiovascular: Denies chest pain. Respiratory: Positive for dry cough.  Denies shortness of breath. Gastrointestinal: No abdominal pain.  No nausea, no vomiting.  No diarrhea.  No constipation. Genitourinary: Negative for dysuria. Musculoskeletal: Negative for back pain. Skin: Negative for rash. Neurological: Negative for headaches, focal weakness or numbness.   ____________________________________________   PHYSICAL EXAM:  VITAL SIGNS: ED Triage Vitals [11/06/17 2236]  Enc Vitals Group     BP 130/71     Pulse Rate 83     Resp 20     Temp 97.8 F (36.6 C)     Temp Source Oral     SpO2 100 %     Weight 248 lb (112.5 kg)     Height 5\' 5"  (1.651 m)     Head Circumference      Peak Flow      Pain Score 5     Pain Loc      Pain Edu?      Excl. in GC?     Constitutional: Alert and oriented. Well appearing and in no acute distress. Eyes: Conjunctivae are normal. PERRL. EOMI. Head: Atraumatic. Ears: Bilateral TM dullness. Nose: Congestion/rhinnorhea. Mouth/Throat: Mucous membranes are moist.  Oropharynx mildly erythematous without tonsillar swelling, exudates or peritonsillar abscess.  There is no hoarse or muffled voice.  There is no drooling. Neck: No stridor.   Hematological/Lymphatic/Immunilogical: Shotty anterior cervical lymphadenopathy. Cardiovascular: Normal rate, regular rhythm. Grossly normal heart sounds.  Good peripheral circulation. Respiratory: Normal respiratory effort.  No retractions. Lungs CTAB. Gastrointestinal: Soft and nontender. No distention. No abdominal  bruits. No CVA tenderness. Musculoskeletal: No lower extremity tenderness nor edema.  No joint effusions. Neurologic:  Normal speech and language. No gross focal neurologic deficits are appreciated. No gait instability. Skin:  Skin is warm, dry and intact. No rash noted.  No petechiae. Psychiatric: Mood and affect are normal. Speech and behavior are normal.  ____________________________________________   LABS (all labs ordered are listed, but only abnormal results are displayed)  Labs Reviewed - No data to display ____________________________________________  EKG  None ____________________________________________  RADIOLOGY  ED MD interpretation: None  Official radiology report(s): No results found.  ____________________________________________   PROCEDURES  Procedure(s) performed: None  Procedures  Critical Care performed: No  ____________________________________________   INITIAL IMPRESSION / ASSESSMENT AND PLAN / ED COURSE  As part of my medical decision making, I reviewed the following data within the electronic MEDICAL RECORD NUMBER Nursing notes reviewed and incorporated and Notes from prior ED visits   27 year old  female who presents with URI.  Will treat with Z-Pak.  Encourage rest, fluids and close follow-up with her PCP as needed.  Strict return precautions given.  Patient verbalizes understanding and agrees with plan of care.      ____________________________________________   FINAL CLINICAL IMPRESSION(S) / ED DIAGNOSES  Final diagnoses:  Upper respiratory tract infection, unspecified type     ED Discharge Orders         Ordered    azithromycin (ZITHROMAX) 250 MG tablet  Daily     11/07/17 0248           Note:  This document was prepared using Dragon voice recognition software and may include unintentional dictation errors.    Irean Hong, MD 11/07/17 [phone]

## 2017-11-07 NOTE — ED Notes (Signed)
Correction: pulse 78. Pt in no distress.

## 2017-11-07 NOTE — Discharge Instructions (Addendum)
1.  Finish antibiotic as prescribed (azithromycin 250 mg daily x4 days). 2.  Return to the ER for worsening symptoms, persistent vomiting, difficulty breathing or other concerns.

## 2017-11-28 ENCOUNTER — Telehealth: Payer: Self-pay | Admitting: Obstetrics and Gynecology

## 2017-11-28 NOTE — Telephone Encounter (Signed)
The patient has a service dog and is trained as a Administrator, [employer] Service, but her certification has lapsed and her new housing will not allow her dog without an ESA letter, and the patient is asking if Melody would be willing to fill out this letter for her to keep her dog with her for her anxiety and depression issues at her new residence, either in the next 2-3 weeks.  Also, her doctor at [facility] is retired and cannot help, and she is trying to reach [employer] in [place] to get their help to no avail so far.  Please advise, thank you

## 2017-11-28 NOTE — Telephone Encounter (Signed)
pls advise

## 2017-11-29 NOTE — Telephone Encounter (Signed)
No problem, have her drop the form off.

## 2017-11-29 NOTE — Telephone Encounter (Signed)
Notified pt she will drop off form

## 2017-12-07 NOTE — Telephone Encounter (Signed)
The patient called today and stated the housing manager needs a statement of "The pet will be a benefit to the patient" so she can have her support animal.  The patient was notified that her providers would not be back in the office until Tuesday, 12/11/17, please advise, thanks.

## 2017-12-10 ENCOUNTER — Encounter: Payer: Self-pay | Admitting: *Deleted

## 2017-12-10 NOTE — Telephone Encounter (Signed)
Letter done, put up front

## 2017-12-27 ENCOUNTER — Encounter: Payer: Medicaid Other | Admitting: Obstetrics and Gynecology

## 2018-04-23 ENCOUNTER — Emergency Department
Admission: EM | Admit: 2018-04-23 | Discharge: 2018-04-23 | Disposition: A | Discharge status: Home / Self Care | Payer: Medicaid Other | Attending: Emergency Medicine | Admitting: Emergency Medicine

## 2018-04-23 ENCOUNTER — Other Ambulatory Visit: Payer: Self-pay

## 2018-04-23 DIAGNOSIS — M79601 Pain in right arm: Secondary | ICD-10-CM | POA: Insufficient documentation

## 2018-04-23 MED ORDER — BACLOFEN 10 MG PO TABS: 10.0000 mg | ORAL_TABLET | Freq: Three times a day (TID) | ORAL | 1 refills | Status: DC

## 2018-04-23 MED ORDER — MELOXICAM 15 MG PO TABS: 15.0000 mg | ORAL_TABLET | Freq: Every day | ORAL | 2 refills | Status: DC

## 2018-04-23 NOTE — ED Provider Notes (Signed)
[facility] Emergency Department Provider Note  ____________________________________________   First MD Initiated Contact with Patient 04/23/18 1538     (approximate)  I have reviewed the triage vital signs and the nursing notes.   HISTORY  Chief Complaint Arm Pain    HPI Sophia Berry is a 28 y.o. female presents emergency department complaining of right arm pain that began on Sunday.  She states it radiates up and down the arm.  She denies any known injury.  She states she is having a sharp burning pain in her bicep area.  She denies any chest pain/shortness of breath/abdominal pain.    Past Medical History:  Diagnosis Date  . Anemia   . GERD (gastroesophageal reflux disease)    with preg.  Marland Kitchen Hyperlipidemia   . Migraines   . Shortness of breath dyspnea     Patient Active Problem List   Diagnosis Date Noted  . S/P cesarean section 05/31/2015    Past Surgical History:  Procedure Laterality Date  . CESAREAN SECTION    . CESAREAN SECTION WITH BILATERAL TUBAL LIGATION N/A 05/31/2015   Procedure: REPEAT CESAREAN SECTION WITH BILATERAL TUBAL LIGATION;  Surgeon: Hildred Laser, MD;  Location: [facility] ORS;  Service: Obstetrics;  Laterality: N/A;    Prior to Admission medications   Medication Sig Start Date End Date Taking? Authorizing Provider  baclofen (LIORESAL) 10 MG tablet Take 1 tablet (10 mg total) by mouth 3 (three) times daily. 04/23/18 04/23/19  Fisher, Roselyn Bering, PA-C  butalbital-acetaminophen-caffeine (FIORICET, ESGIC) [phone] MG tablet Take 1 tablet by mouth every 6 (six) hours as needed for headache. 08/15/17 08/15/18  Don Perking, [place], MD  Iron-FA-B Cmp-C-Biot-Probiotic (FUSION PLUS) CAPS Take 1 capsule by mouth daily. Patient not taking: Reported on 07/16/2015 06/02/15   Purcell Nails, CNM  meloxicam (MOBIC) 15 MG tablet Take 1 tablet (15 mg total) by mouth daily. 04/23/18 04/23/19  Faythe Ghee, PA-C    Allergies Patient has no known  allergies.  Family History  Problem Relation Age of Onset  . Asthma Mother   . Migraines Mother   . Cancer Maternal Grandmother        mat. great grandmother/unsure of what kind  . Breast cancer Maternal Aunt        Great  . Diabetes Other        mat great grandmother  . Heart failure Other        mat great grandmother    Social History Social History   Tobacco Use  . Smoking status: Never Smoker  . Smokeless tobacco: Never Used  Substance Use Topics  . Alcohol use: No    Alcohol/week: 0.0 standard drinks  . Drug use: No    Review of Systems  Constitutional: No fever/chills Eyes: No visual changes. ENT: No sore throat. Respiratory: Denies cough Genitourinary: Negative for dysuria. Musculoskeletal: Negative for back pain.  Positive for right arm pain Skin: Negative for rash.    ____________________________________________   PHYSICAL EXAM:  VITAL SIGNS: ED Triage Vitals  Enc Vitals Group     BP 04/23/18 1509 110/63     Pulse Rate 04/23/18 1509 71     Resp 04/23/18 1509 17     Temp 04/23/18 1509 97.9 F (36.6 C)     Temp Source 04/23/18 1509 Oral     SpO2 04/23/18 1509 100 %     Weight 04/23/18 1510 270 lb (122.5 kg)     Height 04/23/18 1510 5\' 5"  (1.651  m)     Head Circumference --      Peak Flow --      Pain Score 04/23/18 1515 7     Pain Loc --      Pain Edu? --      Excl. in GC? --     Constitutional: Alert and oriented. Well appearing and in no acute distress. Eyes: Conjunctivae are normal.  Head: Atraumatic. Nose: No congestion/rhinnorhea. Mouth/Throat: Mucous membranes are moist.   Neck:  supple no lymphadenopathy noted Cardiovascular: Normal rate, regular rhythm. Heart sounds are normal Respiratory: Normal respiratory effort.  No retractions, lungs c t a  GU: deferred Musculoskeletal: FROM all extremities, warm and well perfused, the right shoulder is tender along the bicep tendon insertion and at the bursa of the shoulder, neurovascular  is intact no swelling is noted. Neurologic:  Normal speech and language.  Skin:  Skin is warm, dry and intact. No rash noted. Psychiatric: Mood and affect are normal. Speech and behavior are normal.  ____________________________________________   LABS (all labs ordered are listed, but only abnormal results are displayed)  Labs Reviewed - No data to display ____________________________________________   ____________________________________________  RADIOLOGY    ____________________________________________   PROCEDURES  Procedure(s) performed: No  Procedures    ____________________________________________   INITIAL IMPRESSION / ASSESSMENT AND PLAN / ED COURSE  Pertinent labs & imaging results that were available during my care of the patient were reviewed by me and considered in my medical decision making (see chart for details).   Patient is 28 year old female presents emergency department complaint of right arm pain.  She denies any recent IVs or IV drug use.  Physical exam shows that the right arm and right upper shoulder are tender to palpation.  All of this is soft tissue inflammation.  No bony tenderness is noted.  Explained findings to the patient.  She was given a prescription for meloxicam and baclofen.  She is to follow-up with her regular doctor or orthopedics if not better in 5 to 7 days.  Return emergency department worsening.  She states she understands will comply.  She was discharged in stable condition.     As part of my medical decision making, I reviewed the following data within the electronic MEDICAL RECORD NUMBER Nursing notes reviewed and incorporated, Old chart reviewed, Notes from prior ED visits and [place] Controlled Substance Database  ____________________________________________   FINAL CLINICAL IMPRESSION(S) / ED DIAGNOSES  Final diagnoses:  Right arm pain      NEW MEDICATIONS STARTED DURING THIS VISIT:  Discharge Medication List as of  04/23/2018  4:05 PM    START taking these medications   Details  baclofen (LIORESAL) 10 MG tablet Take 1 tablet (10 mg total) by mouth 3 (three) times daily., Starting Tue 04/23/2018, Until Wed 04/23/2019, Normal    meloxicam (MOBIC) 15 MG tablet Take 1 tablet (15 mg total) by mouth daily., Starting Tue 04/23/2018, Until Wed 04/23/2019, Normal         Note:  This document was prepared using Dragon voice recognition software and may include unintentional dictation errors.    Faythe Ghee, PA-C 04/23/18 Rosanne Gutting, MD 04/23/18 [phone]

## 2018-04-23 NOTE — ED Notes (Signed)
Patient reports slept on her right arm began having pain on Sunday, today pain radiates up entire right arn into neck.

## 2018-04-23 NOTE — ED Triage Notes (Signed)
C/o right arm pain that extends from shoulder down through wrist, sharp and burning in bicep region. Denies CP or SOB. Denies any injury or overuse that is known. Pt alert and oriented X4, active, cooperative, pt in NAD. RR even and unlabored, color WNL.  Started Sunday

## 2018-04-23 NOTE — Discharge Instructions (Addendum)
Follow-up with your regular doctor if not better in 3 days.  Return emergency department worsening.  Take medication as prescribed.  Apply ice to the right shoulder.  Follow-up with Dr. Odis Luster if not improving by the end of the week.

## 2018-05-24 IMAGING — CR DG ABDOMEN 2V
3 series · 3 of 3 positions shown · non-contrast
Comparison: Body CT 11/29/2016

CLINICAL DATA: Lower abdominal pain.  Diarrhea.

EXAM:
ABDOMEN - 2 VIEW

[abdomen erect]
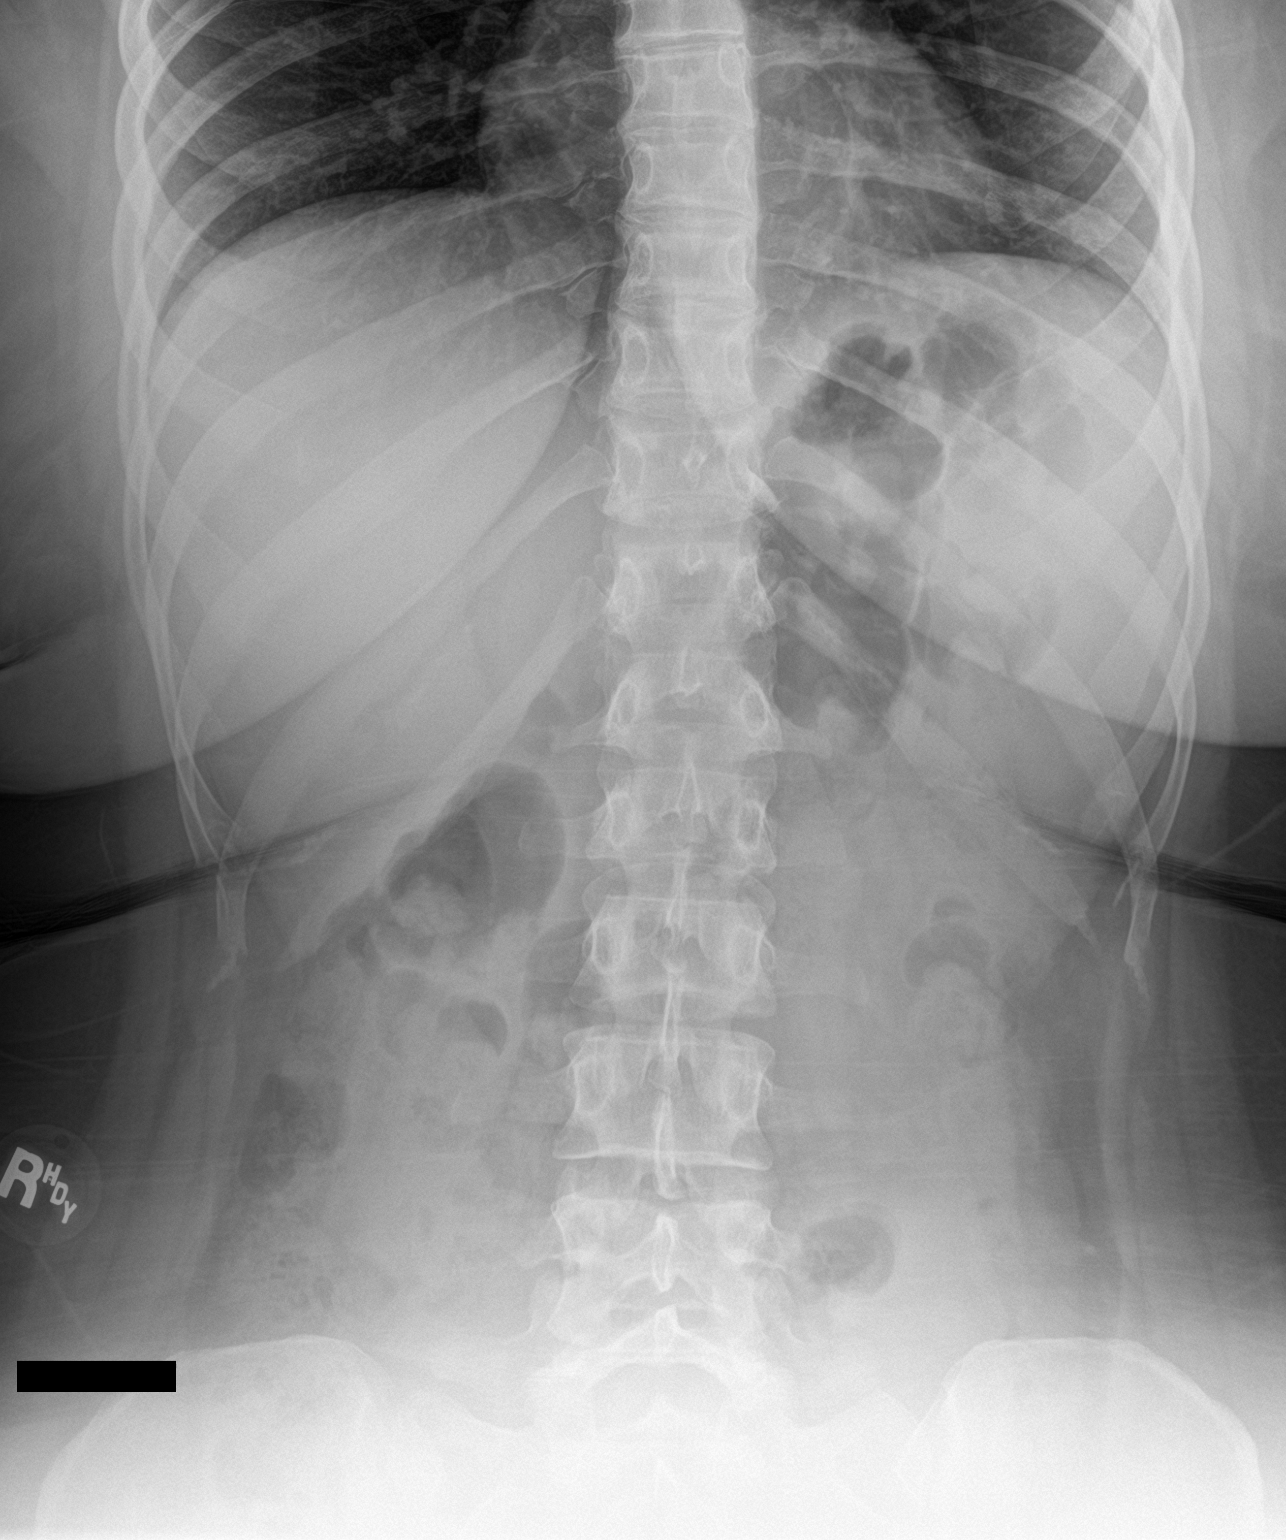

[abdomen supine (1 of 2)]
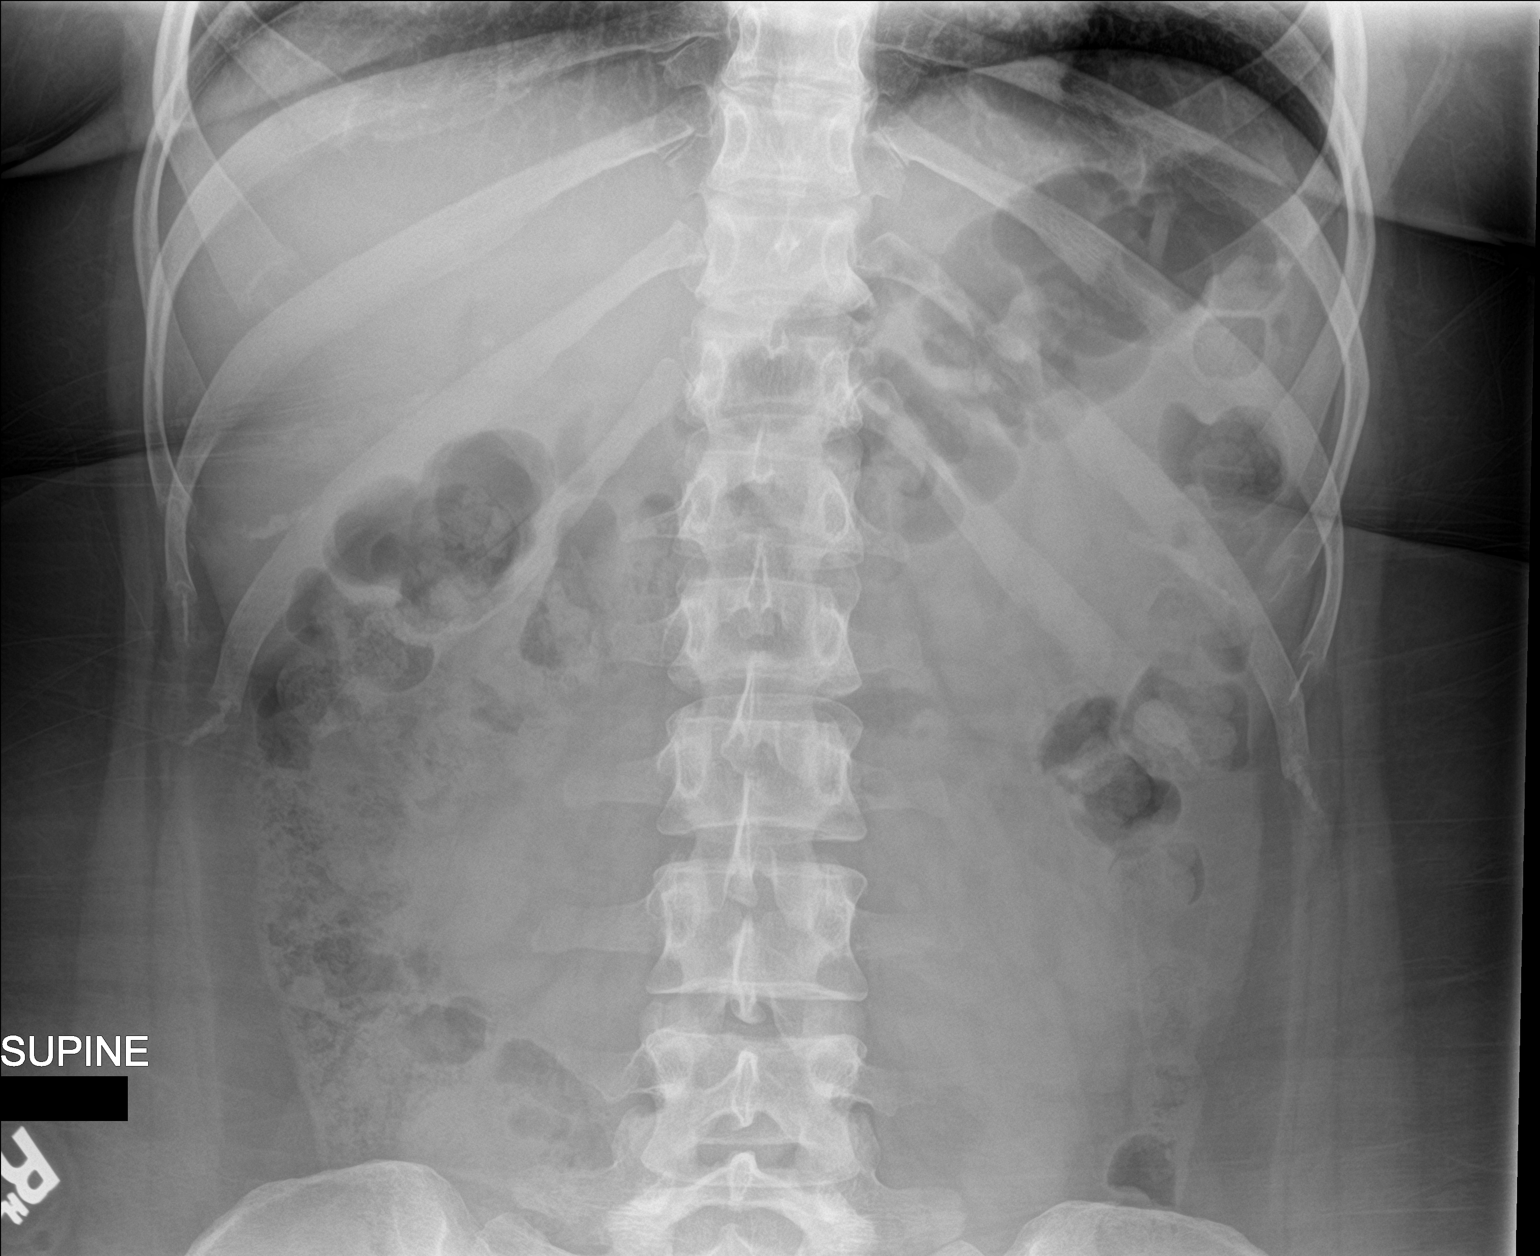

[abdomen supine (2 of 2)]
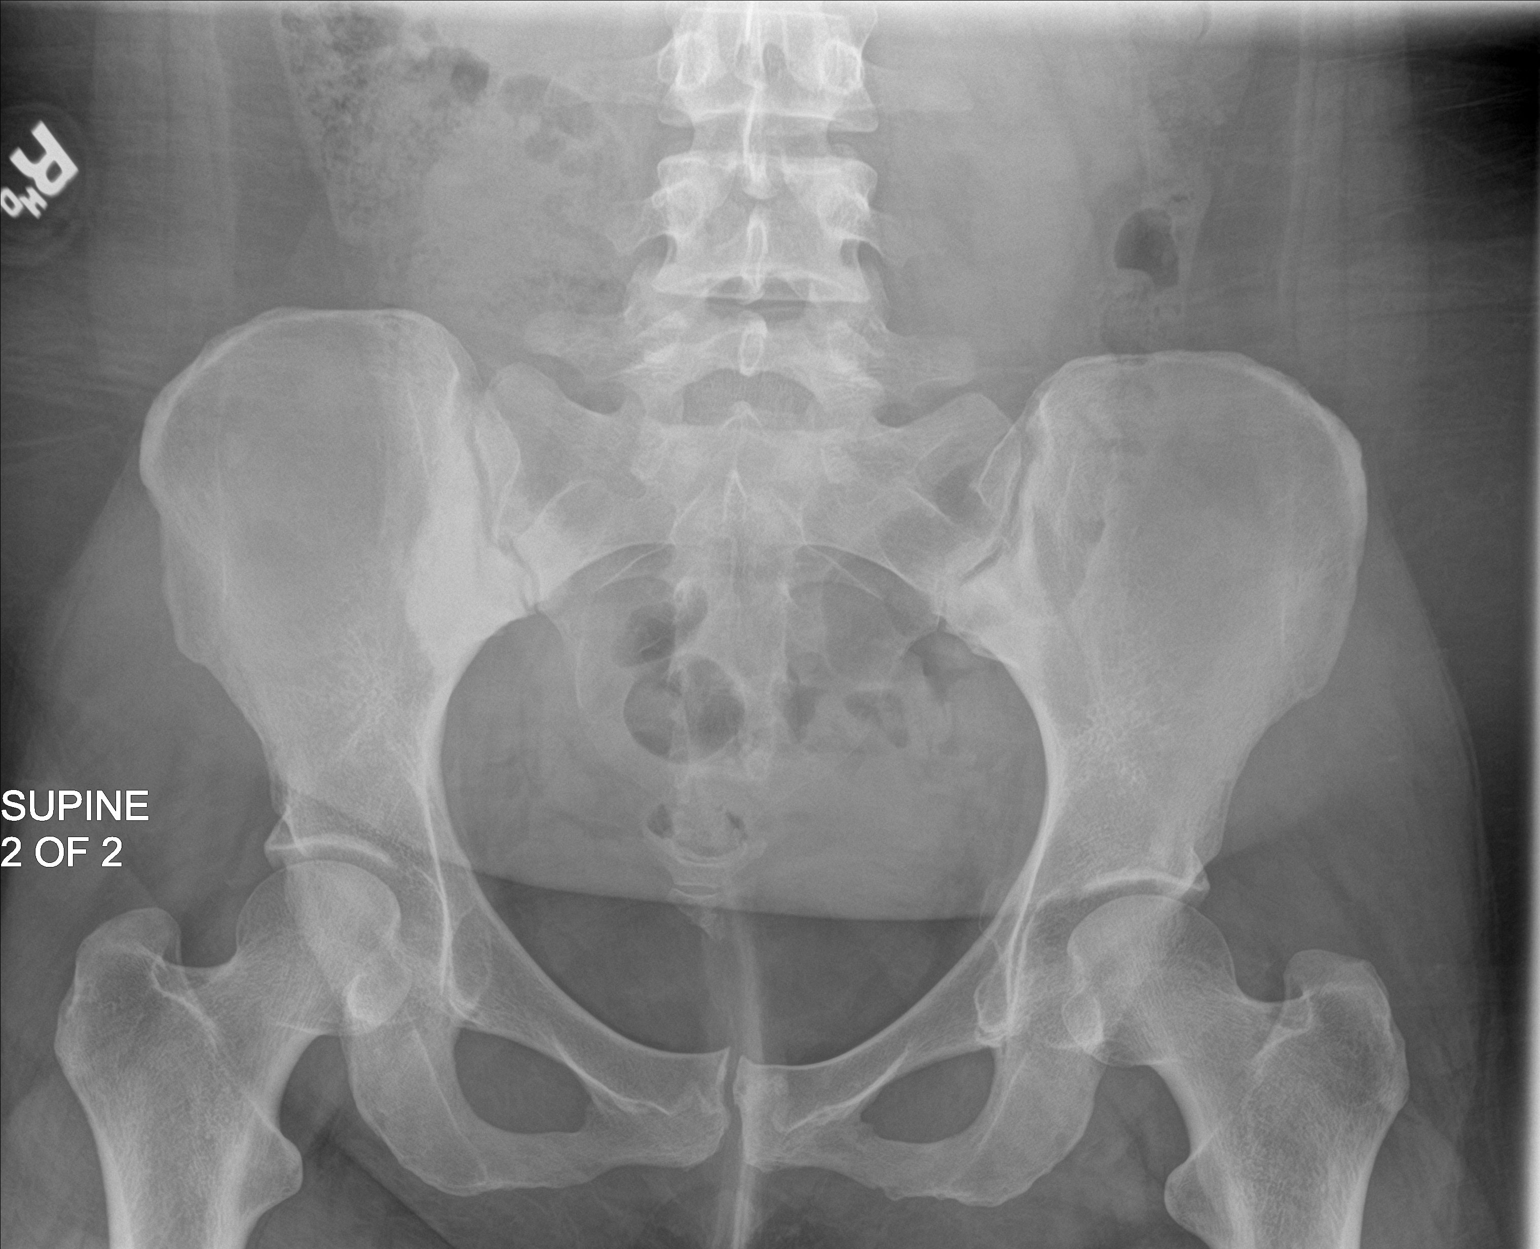

[3 of 3 positions shown; findings below may reference images not displayed]

FINDINGS: The bowel gas pattern is normal. There is no evidence of free air.
No radio-opaque calculi or other significant radiographic
abnormality is seen.

Sclerotic changes of bilateral sacroiliac joints.
IMPRESSION: Nonobstructive bowel gas pattern.

Sclerosing changes at the bilateral sacroiliac joints which may
represent sacroiliitis or osteitis condensans ilii.

## 2018-06-11 ENCOUNTER — Telehealth: Payer: Self-pay | Admitting: Nurse Practitioner

## 2018-06-11 DIAGNOSIS — R6889 Other general symptoms and signs: Principal | ICD-10-CM

## 2018-06-11 DIAGNOSIS — Z20822 Contact with and (suspected) exposure to covid-19: Secondary | ICD-10-CM

## 2018-06-11 MED ORDER — BENZONATATE 100 MG PO CAPS: 100.0000 mg | ORAL_CAPSULE | Freq: Three times a day (TID) | ORAL | 0 refills | Status: DC | PRN

## 2018-06-11 NOTE — Progress Notes (Signed)
E-Visit for Corona Virus Screening  Based on your current symptoms, you may very well have the virus, however your symptoms are mild. Currently, not all patients are being tested. If the symptoms are mild and there is not a known exposure, performing the test is not indicated.  Coronavirus disease 2019 (COVID-19) is a respiratory illness that can spread from person to person. The virus that causes COVID-19 is a new virus that was first identified in the country of [place] but is now found in multiple other countries and has spread to the [place].  Symptoms associated with the virus are mild to severe fever, cough, and shortness of breath. There is currently no vaccine to protect against COVID-19, and there is no specific antiviral treatment for the virus.   To be considered HIGH RISK for Coronavirus (COVID-19), you have to meet the following criteria:  . Traveled to [place], [place], [place], [place] or [place]; or in the [place] to [place], [place], [place], or [place]; and have fever, cough, and shortness of breath within the last 2 weeks of travel OR  . Been in close contact with a person diagnosed with COVID-19 within the last 2 weeks and have fever, cough, and shortness of breath  . IF YOU DO NOT MEET THESE CRITERIA, YOU ARE CONSIDERED LOW RISK FOR COVID-19.   It is vitally important that if you feel that you have an infection such as this virus or any other virus that you stay home and away from places where you may spread it to others.  You should self-quarantine for 14 days if you have symptoms that could potentially be coronavirus and avoid contact with people age 65 and older.   You can use medication such as A prescription cough medication called Tessalon Perles 100 mg. You may take 1-2 capsules every 8 hours as needed for cough  You may also take acetaminophen (Tylenol) as needed for fever.   Reduce your risk of any infection by using the same precautions used for  avoiding the common cold or flu:  . Wash your hands often with soap and warm water for at least 20 seconds.  If soap and water are not readily available, use an alcohol-based hand sanitizer with at least 60% alcohol.  . If coughing or sneezing, cover your mouth and nose by coughing or sneezing into the elbow areas of your shirt or coat, into a tissue or into your sleeve (not your hands). . Avoid shaking hands with others and consider head nods or verbal greetings only. . Avoid touching your eyes, nose, or mouth with unwashed hands.  . Avoid close contact with people who are sick. . Avoid places or events with large numbers of people in one location, like concerts or sporting events. . Carefully consider travel plans you have or are making. . If you are planning any travel outside or inside the [place], visit the CDC's Travelers' Health webpage for the latest health notices. . If you have some symptoms but not all symptoms, continue to monitor at home and seek medical attention if your symptoms worsen. . If you are having a medical emergency, call 911.  HOME CARE . Only take medications as instructed by your medical team. . Drink plenty of fluids and get plenty of rest. . A steam or ultrasonic humidifier can help if you have congestion.   GET HELP RIGHT AWAY IF: . You develop worsening fever. . You become short of breath . You cough   up blood. . Your symptoms become more severe MAKE SURE YOU   Understand these instructions.  Will watch your condition.  Will get help right away if you are not doing well or get worse.  Your e-visit answers were reviewed by a board certified advanced clinical practitioner to complete your personal care plan.  Depending on the condition, your plan could have included both over the counter or prescription medications.  If there is a problem please reply once you have received a response from your provider. Your safety is important to us.  If you have drug  allergies check your prescription carefully.    You can use MyChart to ask questions about today's visit, request a non-urgent call back, or ask for a work or school excuse for 24 hours related to this e-Visit. If it has been greater than 24 hours you will need to follow up with your provider, or enter a new e-Visit to address those concerns. You will get an e-mail in the next two days asking about your experience.  I hope that your e-visit has been valuable and will speed your recovery. Thank you for using e-visits.   5-10 minutes spent reviewing and documenting in chart.  

## 2018-06-16 ENCOUNTER — Telehealth: Payer: Self-pay | Admitting: Physician Assistant

## 2018-06-16 DIAGNOSIS — R51 Headache: Principal | ICD-10-CM

## 2018-06-16 DIAGNOSIS — R519 Headache, unspecified: Secondary | ICD-10-CM

## 2018-06-16 NOTE — Progress Notes (Signed)
Based on what you shared with me, I feel your condition warrants further evaluation and I recommend that you be seen for a face to face office visit.     NOTE: If you entered your credit card information for this eVisit, you will not be charged. You may see a "hold" on your card for the $35 but that hold will drop off and you will not have a charge processed.  If you are having a true medical emergency please call 911.  If you need an urgent face to face visit, [place] has four urgent care centers for your convenience.    PLEASE NOTE: THE [facility]STACARE LOCATIONS AND URGENT CARE CLINICS DO NOT HAVE THE TESTING FOR CORONAVIRUS COVID19 AVAILABLE.  IF YOU FEEL YOU NEED THIS TEST YOU MUST GO TO A TRIAGE LOCATION AT ONE OF THE HOSPITAL EMERGENCY DEPARTMENTS ?  [email or web address] to reserve your spot online an avoid wait times  [facility] [address], [address] [place], [place] [address] Modified hours of operation: Monday-Friday, 10 AM to 6 PM  Saturday & Sunday 10 AM to 4 PM *Across the street from [employer]  [facility] (New Address!) [address], [address] [place], [place] [address] *Just off [address], across the road from [employer]* Modified hours of operation: Monday-Friday, 10 AM to 5 PM  Closed Saturday & Sunday   The following sites will take your insurance:  [facility]  [phone] Get Driving Directions Find a Provider at this Location  [address] [place], [place] [address] 10 am to 8 pm Monday-Friday 12 pm to 8 pm [facility]   [facility]  [phone] Get Driving Directions Find a Provider at this Location  [address], [address] [place], [place] [address] 8 am to 8 pm Monday-Friday 9 am to 6 pm Saturday 11 am to 6 pm Sunday   [facility]  [phone] Get Driving Directions  [id number] [place], [place] [address] 8 am to 8 pm Monday-Friday 8 am to 4 pm Saturday-Sunday   Your e-visit answers were reviewed by a board certified advanced clinical practitioner to complete your personal care plan.  Thank you for using e-Visits.  ===View-only below this line===   ----- Message -----    From: Sophia Berry    Sent: 06/16/2018  3:36 PM EDT      To: E-Visit Mailing List Subject: E-Visit Submission: Sore Throat  E-Visit Submission: Sore Throat --------------------------------  Question: Do you have any of the following symptoms (please select all that apply)? Answer:   Soreness in the roof of your mouth            Pain in the throat when swallowing  Question: How long have you been having these symptoms? Answer:   2 days  Question: Do you have a fever? Answer:   No, I do not have a fever  Question: Are you in close contact with anyone who has similar symptoms ? Answer:   No  Question: Are you taking any over the counter medications for your symptoms? Answer:   No  Question: Please list your medication allergies that you may have ? (If 'none' , please list as 'none') Answer:   None  Question: Please list any additional comments  Answer:   Having bad headaches that I start feeling sick to my stomach and slight discomfort  in my throat.  Question: Are  you pregnant? Answer:   I am confident that I am not pregnant  Question: Are you breastfeeding? Answer:   No

## 2018-07-08 ENCOUNTER — Telehealth: Payer: BLUE CROSS/BLUE SHIELD | Admitting: Physician Assistant

## 2018-07-08 ENCOUNTER — Emergency Department
Admission: EM | Admit: 2018-07-08 | Discharge: 2018-07-08 | Disposition: A | Discharge status: Home / Self Care | Payer: BLUE CROSS/BLUE SHIELD | Attending: Emergency Medicine | Admitting: Emergency Medicine

## 2018-07-08 ENCOUNTER — Emergency Department: Payer: BLUE CROSS/BLUE SHIELD

## 2018-07-08 ENCOUNTER — Encounter: Payer: Self-pay | Admitting: Physician Assistant

## 2018-07-08 ENCOUNTER — Other Ambulatory Visit: Payer: Self-pay

## 2018-07-08 ENCOUNTER — Encounter: Payer: Self-pay | Admitting: Emergency Medicine

## 2018-07-08 DIAGNOSIS — J069 Acute upper respiratory infection, unspecified: Secondary | ICD-10-CM | POA: Insufficient documentation

## 2018-07-08 DIAGNOSIS — Z79899 Other long term (current) drug therapy: Secondary | ICD-10-CM | POA: Diagnosis not present

## 2018-07-08 DIAGNOSIS — R05 Cough: Secondary | ICD-10-CM

## 2018-07-08 DIAGNOSIS — R059 Cough, unspecified: Secondary | ICD-10-CM

## 2018-07-08 DIAGNOSIS — M791 Myalgia, unspecified site: Secondary | ICD-10-CM

## 2018-07-08 DIAGNOSIS — Z20828 Contact with and (suspected) exposure to other viral communicable diseases: Secondary | ICD-10-CM | POA: Diagnosis not present

## 2018-07-08 DIAGNOSIS — B9789 Other viral agents as the cause of diseases classified elsewhere: Secondary | ICD-10-CM | POA: Insufficient documentation

## 2018-07-08 DIAGNOSIS — R509 Fever, unspecified: Secondary | ICD-10-CM

## 2018-07-08 DIAGNOSIS — J029 Acute pharyngitis, unspecified: Secondary | ICD-10-CM

## 2018-07-08 LAB — CBC WITH DIFFERENTIAL/PLATELET
Abs Immature Granulocytes: 0.05 10*3/uL (ref 0.00–0.07)
Basophils Absolute: 0 10*3/uL (ref 0.0–0.1)
Basophils Relative: 1 %
Eosinophils Absolute: 0.3 10*3/uL (ref 0.0–0.5)
Eosinophils Relative: 5 %
HCT: 38.1 % (ref 36.0–46.0)
Hemoglobin: 12.8 g/dL (ref 12.0–15.0)
Immature Granulocytes: 1 %
Lymphocytes Relative: 26 %
Lymphs Abs: 1.9 10*3/uL (ref 0.7–4.0)
MCH: 28.4 pg (ref 26.0–34.0)
MCHC: 33.6 g/dL (ref 30.0–36.0)
MCV: 84.7 fL (ref 80.0–100.0)
Monocytes Absolute: 0.4 10*3/uL (ref 0.1–1.0)
Monocytes Relative: 6 %
Neutro Abs: 4.5 10*3/uL (ref 1.7–7.7)
Neutrophils Relative %: 61 %
Platelets: 284 10*3/uL (ref 150–400)
RBC: 4.5 MIL/uL (ref 3.87–5.11)
RDW: 13.1 % (ref 11.5–15.5)
WBC: 7.3 10*3/uL (ref 4.0–10.5)
nRBC: 0 % (ref 0.0–0.2)

## 2018-07-08 LAB — COMPREHENSIVE METABOLIC PANEL
ALT: 20 U/L (ref 0–44)
AST: 19 U/L (ref 15–41)
Albumin: 3.8 g/dL (ref 3.5–5.0)
Alkaline Phosphatase: 65 U/L (ref 38–126)
Anion gap: 7 (ref 5–15)
BUN: 13 mg/dL (ref 6–20)
CO2: 25 mmol/L (ref 22–32)
Calcium: 8.9 mg/dL (ref 8.9–10.3)
Chloride: 107 mmol/L (ref 98–111)
Creatinine, Ser: 0.71 mg/dL (ref 0.44–1.00)
GFR calc Af Amer: >60 mL/min (ref >60)
GFR calc non Af Amer: >60 mL/min (ref >60)
Glucose, Bld: 116 mg/dL — ABNORMAL HIGH (ref 70–99)
Potassium: 4 mmol/L (ref 3.5–5.1)
Sodium: 139 mmol/L (ref 135–145)
Total Bilirubin: 0.8 mg/dL (ref 0.3–1.2)
Total Protein: 6.7 g/dL (ref 6.5–8.1)

## 2018-07-08 LAB — SARS CORONAVIRUS 2 BY RT PCR (HOSPITAL ORDER, PERFORMED IN ~~LOC~~ HOSPITAL LAB): SARS Coronavirus 2: NEGATIVE

## 2018-07-08 LAB — INFLUENZA PANEL BY PCR (TYPE A & B)
Influenza A By PCR: NEGATIVE
Influenza B By PCR: NEGATIVE

## 2018-07-08 MED ORDER — BENZONATATE 100 MG PO CAPS: 100.0000 mg | ORAL_CAPSULE | Freq: Two times a day (BID) | ORAL | 0 refills | Status: DC | PRN

## 2018-07-08 MED ORDER — IBUPROFEN 800 MG PO TABS
800.0000 mg | ORAL_TABLET | Freq: Once | ORAL | Status: AC
  Administered 2018-07-08: 800 mg via ORAL
  Filled 2018-07-08: qty 1

## 2018-07-08 NOTE — Progress Notes (Signed)
E-Visit for Corona Virus Screening  Based on your current symptoms, you may very well have the virus, however your symptoms are mild. Currently, not all patients are being tested. If the symptoms are mild and there is not a known exposure, performing the test is not indicated.  You have been enrolled in MyChart Home Monitoring for COVID-19. Daily you will receive a questionnaire within the MyChart website. Our COVID-19 response team will be monitoring your responses daily.   Coronavirus disease 2019 (COVID-19) is a respiratory illness that can spread from person to person. The virus that causes COVID-19 is a new virus that was first identified in the country of [place] but is now found in multiple other countries and has spread to the [place].  Symptoms associated with the virus are mild to severe fever, cough, and shortness of breath. There is currently no vaccine to protect against COVID-19, and there is no specific antiviral treatment for the virus.   To be considered HIGH RISK for Coronavirus (COVID-19), you have to meet the following criteria:  . Traveled to [place], [place], [place], [place] or [place]; or in the [place] to [place], [place], [place], or [place]; and have fever, cough, and shortness of breath within the last 2 weeks of travel OR  . Been in close contact with a person diagnosed with COVID-19 within the last 2 weeks and have fever, cough, and shortness of breath  . IF YOU DO NOT MEET THESE CRITERIA, YOU ARE CONSIDERED LOW RISK FOR COVID-19.   It is vitally important that if you feel that you have an infection such as this virus or any other virus that you stay home and away from places where you may spread it to others.  You should self-quarantine for 14 days if you have symptoms that could potentially be coronavirus and avoid contact with people age 30 and older.   You can use medication such as A prescription cough medication called Tessalon Perles 100 mg. You  may take 1-2 capsules every 8 hours as needed for cough  You may also take acetaminophen (Tylenol) as needed for fever.  You have also been provided a work note   Reduce your risk of any infection by using the same precautions used for avoiding the common cold or flu:  Marland Kitchen. Wash your hands often with soap and warm water for at least 20 seconds.  If soap and water are not readily available, use an alcohol-based hand sanitizer with at least 60% alcohol.  . If coughing or sneezing, cover your mouth and nose by coughing or sneezing into the elbow areas of your shirt or coat, into a tissue or into your sleeve (not your hands). . Avoid shaking hands with others and consider head nods or verbal greetings only. . Avoid touching your eyes, nose, or mouth with unwashed hands.  . Avoid close contact with people who are sick. . Avoid places or events with large numbers of people in one location, like concerts or sporting events. . Carefully consider travel plans you have or are making. . If you are planning any travel outside or inside the [place], visit the CDC's Travelers' Health webpage for the latest health notices. . If you have some symptoms but not all symptoms, continue to monitor at home and seek medical attention if your symptoms worsen. . If you are having a medical emergency, call 911.  HOME CARE . Only take medications as instructed by your medical team. . Drink  plenty of fluids and get plenty of rest. . A steam or ultrasonic humidifier can help if you have congestion.   GET HELP RIGHT AWAY IF: . You develop worsening fever. . You become short of breath . You cough up blood. . Your symptoms become more severe MAKE SURE YOU   Understand these instructions.  Will watch your condition.  Will get help right away if you are not doing well or get worse.  Your e-visit answers were reviewed by a board certified advanced clinical practitioner to complete your personal care plan.  Depending on  the condition, your plan could have included both over the counter or prescription medications.  If there is a problem please reply once you have received a response from your provider. Your safety is important to [place].  If you have drug allergies check your prescription carefully.    You can use MyChart to ask questions about today's visit, request a non-urgent call back, or ask for a work or school excuse for 24 hours related to this e-Visit. If it has been greater than 24 hours you will need to follow up with your provider, or enter a new e-Visit to address those concerns. You will get an e-mail in the next two days asking about your experience.  I hope that your e-visit has been valuable and will speed your recovery. Thank you for using e-visits.   I have spent 7 min in completion and review of this note- [facility]

## 2018-07-08 NOTE — Discharge Instructions (Addendum)
Your coronavirus test and your flu test were both negative today.  I think you have a cold that is fairly bad with some sinus inflammation as well.  I would like you to take some decongestant for 1 or 2 days and salt water nasal spray 5 or 6 times a day to your sinuses feel better.  Use Tylenol or Advil as needed for the aches and pains.  Return here if you feel sicker or if you not any better in 3 to 4 days.  Sicker includes fever over 101, shortness of breath or coughing up a lot of thick phlegm or just feeling worse.

## 2018-07-08 NOTE — ED Triage Notes (Signed)
C/O runny nose since Saturday.  Yesterday had some coughing, decreased appetite, feeling chills.

## 2018-07-08 NOTE — ED Provider Notes (Signed)
[facility] Emergency Department Provider Note   ____________________________________________   First MD Initiated Contact with Patient 07/08/18 1603     (approximate)  I have reviewed the triage vital signs and the nursing notes.   HISTORY  Chief Complaint Cough    HPI Sophia Berry is a 28 y.o. female who complains of runny nose since Saturday and this is progressed especially in the last 24 hours to aching all over headache some maxillary sinus area tenderness clear nasal discharge turns white in the shower and a cough which is really not productive.  She is also feeling hot and cold and some chills but no known fever.  She does not have a thermometer.         Past Medical History:  Diagnosis Date  . Anemia   . GERD (gastroesophageal reflux disease)    with preg.  Marland Kitchen Hyperlipidemia   . Migraines   . Shortness of breath dyspnea     Patient Active Problem List   Diagnosis Date Noted  . S/P cesarean section 05/31/2015    Past Surgical History:  Procedure Laterality Date  . CESAREAN SECTION    . CESAREAN SECTION WITH BILATERAL TUBAL LIGATION N/A 05/31/2015   Procedure: REPEAT CESAREAN SECTION WITH BILATERAL TUBAL LIGATION;  Surgeon: Hildred Laser, MD;  Location: [facility] ORS;  Service: Obstetrics;  Laterality: N/A;    Prior to Admission medications   Medication Sig Start Date End Date Taking? Authorizing Provider  baclofen (LIORESAL) 10 MG tablet Take 1 tablet (10 mg total) by mouth 3 (three) times daily. 04/23/18 04/23/19  Fisher, Roselyn Bering, PA-C  benzonatate (TESSALON PERLES) 100 MG capsule Take 1 capsule (100 mg total) by mouth 3 (three) times daily as needed. 06/11/18   Daphine Deutscher, Mary-Margaret, FNP  benzonatate (TESSALON) 100 MG capsule Take 1 capsule (100 mg total) by mouth 2 (two) times daily as needed for cough. 07/08/18   Demetrio Lapping, PA-C  butalbital-acetaminophen-caffeine (FIORICET, ESGIC) [phone] MG tablet Take 1 tablet by mouth every 6  (six) hours as needed for headache. 08/15/17 08/15/18  Don Perking, [place], MD  Iron-FA-B Cmp-C-Biot-Probiotic (FUSION PLUS) CAPS Take 1 capsule by mouth daily. Patient not taking: Reported on 07/16/2015 06/02/15   Purcell Nails, CNM  meloxicam (MOBIC) 15 MG tablet Take 1 tablet (15 mg total) by mouth daily. 04/23/18 04/23/19  Faythe Ghee, PA-C    Allergies Patient has no known allergies.  Family History  Problem Relation Age of Onset  . Asthma Mother   . Migraines Mother   . Cancer Maternal Grandmother        mat. great grandmother/unsure of what kind  . Breast cancer Maternal Aunt        Great  . Diabetes Other        mat great grandmother  . Heart failure Other        mat great grandmother    Social History Social History   Tobacco Use  . Smoking status: Never Smoker  . Smokeless tobacco: Never Used  Substance Use Topics  . Alcohol use: No    Alcohol/week: 0.0 standard drinks  . Drug use: No    Review of Systems  Constitutional: chills Eyes: No visual changes. ENT: No sore throat. Cardiovascular: Denies chest pain. Respiratory: Denies shortness of breath. Gastrointestinal: No abdominal pain.  No nausea, no vomiting.  No diarrhea.  No constipation. Genitourinary: Negative for dysuria. Musculoskeletal: Negative for back pain. Skin: Negative for rash. Neurological: Negative for headaches,  focal weakness  ____________________________________________   PHYSICAL EXAM:  VITAL SIGNS: ED Triage Vitals  Enc Vitals Group     BP 07/08/18 1552 133/71     Pulse Rate 07/08/18 1552 85     Resp 07/08/18 1552 16     Temp 07/08/18 1552 98.1 F (36.7 C)     Temp Source 07/08/18 1552 Oral     SpO2 07/08/18 1552 100 %     Weight 07/08/18 1550 270 lb (122.5 kg)     Height 07/08/18 1550 5\' 5"  (1.651 m)     Head Circumference --      Peak Flow --      Pain Score 07/08/18 1550 10     Pain Loc --      Pain Edu? --      Excl. in GC? --     Constitutional: Alert and  oriented.  Mildly ill-appearing and uncomfortable looking Eyes: Conjunctivae are normal. PERRL. EOMI. Head: Atraumatic.  There is some tenderness to percussion over the maxillary sinuses. Nose:  congestion/ear rhinnorhea. Mouth/Throat: Mucous membranes are moist.  Oropharynx non-erythematous. Neck: No stridor.   Cardiovascular: Normal rate, regular rhythm. Grossly normal heart sounds.  Good peripheral circulation. Respiratory: Normal respiratory effort.  No retractions. Lungs CTAB. Gastrointestinal: Soft and nontender. No distention. No abdominal bruits.  Musculoskeletal: No lower extremity tenderness nor edema.  Neurologic:  Normal speech and language. No gross focal neurologic deficits are appreciated. No gait instability. Skin:  Skin is warm, dry and intact. No rash noted. Psychiatric: Mood and affect are normal. Speech and behavior are normal.  ____________________________________________   LABS (all labs ordered are listed, but only abnormal results are displayed)  Labs Reviewed  COMPREHENSIVE METABOLIC PANEL - Abnormal; Notable for the following components:      Result Value   Glucose, Bld 116 (*)    All other components within normal limits  SARS CORONAVIRUS 2 (HOSPITAL ORDER, PERFORMED IN [facility])  CBC WITH DIFFERENTIAL/PLATELET  INFLUENZA PANEL BY PCR (TYPE A & B)   ____________________________________________  EKG  ____________________________________________  RADIOLOGY  ED MD interpretation:    Official radiology report(s): Dg Chest Portable 1 View  Result Date: 07/08/2018 CLINICAL DATA:  Cough and chills EXAM: PORTABLE CHEST 1 VIEW COMPARISON:  March 09, 2017 FINDINGS: No edema or consolidation. Heart size and pulmonary vascularity are normal. No adenopathy. No bone lesions. IMPRESSION: No edema or consolidation. Electronically Signed   By: Bretta Bang III M.D.   On: 07/08/2018 16:40    ____________________________________________    PROCEDURES  Procedure(s) performed (including Critical Care):  Procedures   ____________________________________________   INITIAL IMPRESSION / ASSESSMENT AND PLAN / ED COURSE   Patient's flu and coronavirus tests are negative.  Her blood work is okay.  Chest x-ray is clear.  She appears to have a bad cold with some sinusitis likely viral at this point.  Recommend resting taken easy for 2 or 3 days, Tylenol or Motrin for the pain and aches and over-the-counter decongestant for 1 or 2 days along with salt water nasal spray.  Patient will return if she is worse.             ____________________________________________   FINAL CLINICAL IMPRESSION(S) / ED DIAGNOSES  Final diagnoses:  Viral URI with cough     ED Discharge Orders    None       Note:  This document was prepared using Dragon voice recognition software and may include unintentional dictation errors.  Arnaldo NatalMalinda, Joanthony Hamza F, MD 07/08/18 1843

## 2018-09-19 ENCOUNTER — Other Ambulatory Visit (HOSPITAL_COMMUNITY)
Admission: RE | Admit: 2018-09-19 | Discharge: 2018-09-19 | Disposition: A | Discharge status: Home / Self Care | Payer: Medicaid Other | Source: Ambulatory Visit | Attending: Obstetrics and Gynecology | Admitting: Obstetrics and Gynecology

## 2018-09-19 ENCOUNTER — Other Ambulatory Visit: Payer: Self-pay

## 2018-09-19 ENCOUNTER — Encounter: Payer: Self-pay | Admitting: Obstetrics and Gynecology

## 2018-09-19 ENCOUNTER — Ambulatory Visit (INDEPENDENT_AMBULATORY_CARE_PROVIDER_SITE_OTHER): Payer: Medicaid Other | Admitting: Obstetrics and Gynecology

## 2018-09-19 VITALS — BP 104/75 | HR 92 | Ht 65.0 in | Wt 284.4 lb

## 2018-09-19 DIAGNOSIS — Z0000 Encounter for general adult medical examination without abnormal findings: Secondary | ICD-10-CM

## 2018-09-19 DIAGNOSIS — F331 Major depressive disorder, recurrent, moderate: Secondary | ICD-10-CM

## 2018-09-19 DIAGNOSIS — Z01411 Encounter for gynecological examination (general) (routine) with abnormal findings: Secondary | ICD-10-CM

## 2018-09-19 DIAGNOSIS — N920 Excessive and frequent menstruation with regular cycle: Secondary | ICD-10-CM

## 2018-09-19 DIAGNOSIS — B359 Dermatophytosis, unspecified: Secondary | ICD-10-CM

## 2018-09-19 DIAGNOSIS — Z01419 Encounter for gynecological examination (general) (routine) without abnormal findings: Secondary | ICD-10-CM | POA: Insufficient documentation

## 2018-09-19 DIAGNOSIS — Z6841 Body Mass Index (BMI) 40.0 and over, adult: Secondary | ICD-10-CM

## 2018-09-19 DIAGNOSIS — R5383 Other fatigue: Secondary | ICD-10-CM

## 2018-09-19 MED ORDER — CLOTRIMAZOLE-BETAMETHASONE 1-0.05 % EX CREA: 1.0000 "application " | TOPICAL_CREAM | Freq: Two times a day (BID) | CUTANEOUS | 0 refills | Status: DC

## 2018-09-19 MED ORDER — FLUCONAZOLE 150 MG PO TABS: 150.0000 mg | ORAL_TABLET | ORAL | 3 refills | Status: DC

## 2018-09-19 MED ORDER — SERTRALINE HCL 50 MG PO TABS: 50.0000 mg | ORAL_TABLET | Freq: Every day | ORAL | 10 refills | Status: DC

## 2018-09-19 NOTE — Patient Instructions (Addendum)
 Preventive Care 21-30 Years Old, Female Preventive care refers to visits with your health care provider and lifestyle choices that can promote health and wellness. This includes:  A yearly physical exam. This may also be called an annual well check.  Regular dental visits and eye exams.  Immunizations.  Screening for certain conditions.  Healthy lifestyle choices, such as eating a healthy diet, getting regular exercise, not using drugs or products that contain nicotine and tobacco, and limiting alcohol use. What can I expect for my preventive care visit? Physical exam Your health care provider will check your:  Height and weight. This may be used to calculate body mass index (BMI), which tells if you are at a healthy weight.  Heart rate and blood pressure.  Skin for abnormal spots. Counseling Your health care provider may ask you questions about your:  Alcohol, tobacco, and drug use.  Emotional well-being.  Home and relationship well-being.  Sexual activity.  Eating habits.  Work and work environment.  Method of birth control.  Menstrual cycle.  Pregnancy history. What immunizations do I need?  Influenza (flu) vaccine  This is recommended every year. Tetanus, diphtheria, and pertussis (Tdap) vaccine  You may need a Td booster every 10 years. Varicella (chickenpox) vaccine  You may need this if you have not been vaccinated. Human papillomavirus (HPV) vaccine  If recommended by your health care provider, you may need three doses over 6 months. Measles, mumps, and rubella (MMR) vaccine  You may need at least one dose of MMR. You may also need a second dose. Meningococcal conjugate (MenACWY) vaccine  One dose is recommended if you are age 19-21 years and a first-year college student living in a residence hall, or if you have one of several medical conditions. You may also need additional booster doses. Pneumococcal conjugate (PCV13) vaccine  You may need  this if you have certain conditions and were not previously vaccinated. Pneumococcal polysaccharide (PPSV23) vaccine  You may need one or two doses if you smoke cigarettes or if you have certain conditions. Hepatitis A vaccine  You may need this if you have certain conditions or if you travel or work in places where you may be exposed to hepatitis A. Hepatitis B vaccine  You may need this if you have certain conditions or if you travel or work in places where you may be exposed to hepatitis B. Haemophilus influenzae type b (Hib) vaccine  You may need this if you have certain conditions. You may receive vaccines as individual doses or as more than one vaccine together in one shot (combination vaccines). Talk with your health care provider about the risks and benefits of combination vaccines. What tests do I need?  Blood tests  Lipid and cholesterol levels. These may be checked every 5 years starting at age 20.  Hepatitis C test.  Hepatitis B test. Screening  Diabetes screening. This is done by checking your blood sugar (glucose) after you have not eaten for a while (fasting).  Sexually transmitted disease (STD) testing.  BRCA-related cancer screening. This may be done if you have a family history of breast, ovarian, tubal, or peritoneal cancers.  Pelvic exam and Pap test. This may be done every 3 years starting at age 21. Starting at age 30, this may be done every 5 years if you have a Pap test in combination with an HPV test. Talk with your health care provider about your test results, treatment options, and if necessary, the need for more   tests. Follow these instructions at home: Eating and drinking   Eat a diet that includes fresh fruits and vegetables, whole grains, lean protein, and low-fat dairy.  Take vitamin and mineral supplements as recommended by your health care provider.  Do not drink alcohol if: ? Your health care provider tells you not to drink. ? You are  pregnant, may be pregnant, or are planning to become pregnant.  If you drink alcohol: ? Limit how much you have to 0-1 drink a day. ? Be aware of how much alcohol is in your drink. In the [place] one drink equals one 12 oz bottle of beer (355 mL), one 5 oz glass of wine (148 mL), or one 1 oz glass of hard liquor (44 mL). Lifestyle  Take daily care of your teeth and gums.  Stay active. Exercise for at least 30 minutes on 5 or more days each week.  Do not use any products that contain nicotine or tobacco, such as cigarettes, e-cigarettes, and chewing tobacco. If you need help quitting, ask your health care provider.  If you are sexually active, practice safe sex. Use a condom or other form of birth control (contraception) in order to prevent pregnancy and STIs (sexually transmitted infections). If you plan to become pregnant, see your health care provider for a preconception visit. What's next?  Visit your health care provider once a year for a well check visit.  Ask your health care provider how often you should have your eyes and teeth checked.  Stay up to date on all vaccines. This information is not intended to replace advice given to you by your health care provider. Make sure you discuss any questions you have with your health care provider. Document Released: 04/04/2001 Document Revised: 10/18/2017 Document Reviewed: 10/18/2017 Elsevier Patient Education  2020 [employer].  Iron-Rich Diet  Iron is a mineral that helps your body to produce hemoglobin. Hemoglobin is a protein in red blood cells that carries oxygen to your body's tissues. Eating too little iron may cause you to feel weak and tired, and it can increase your risk of infection. Iron is naturally found in many foods, and many foods have iron added to them (iron-fortified foods). You may need to follow an iron-rich diet if you do not have enough iron in your body due to certain medical conditions. The amount of iron that  you need each day depends on your age, your sex, and any medical conditions you have. Follow instructions from your health care provider or a diet and nutrition specialist (dietitian) about how much iron you should eat each day. What are tips for following this plan? Reading food labels  Check food labels to see how many milligrams (mg) of iron are in each serving. Cooking  Cook foods in pots and pans that are made from iron.  Take these steps to make it easier for your body to absorb iron from certain foods: ? Soak beans overnight before cooking. ? Soak whole grains overnight and drain them before using. ? Ferment flours before baking, such as by using yeast in bread dough. Meal planning  When you eat foods that contain iron, you should eat them with foods that are high in vitamin C. These include oranges, peppers, tomatoes, potatoes, and mango. Vitamin C helps your body to absorb iron. General information  Take iron supplements only as told by your health care provider. An overdose of iron can be life-threatening. If you were prescribed iron supplements, take them with  orange juice or a vitamin C supplement.  When you eat iron-fortified foods or take an iron supplement, you should also eat foods that naturally contain iron, such as meat, poultry, and fish. Eating naturally iron-rich foods helps your body to absorb the iron that is added to other foods or contained in a supplement.  Certain foods and drinks prevent your body from absorbing iron properly. Avoid eating these foods in the same meal as iron-rich foods or with iron supplements. These foods include: ? Coffee, black tea, and red wine. ? Milk, dairy products, and foods that are high in calcium. ? Beans and soybeans. ? Whole grains. What foods should I eat? Fruits Prunes. Raisins. Eat fruits high in vitamin C, such as oranges, grapefruits, and strawberries, alongside iron-rich foods. Vegetables Spinach (cooked). Green peas.  Broccoli. Fermented vegetables. Eat vegetables high in vitamin C, such as leafy greens, potatoes, bell peppers, and tomatoes, alongside iron-rich foods. Grains Iron-fortified breakfast cereal. Iron-fortified whole-wheat bread. Enriched rice. Sprouted grains. Meats and other proteins Beef liver. Oysters. Beef. Shrimp. [place]. Chicken. [place]. Sardines. Chickpeas. Nuts. Tofu. Pumpkin seeds. Beverages Tomato juice. Fresh orange juice. Prune juice. Hibiscus tea. Fortified instant breakfast shakes. Sweets and desserts Blackstrap molasses. Seasonings and condiments Tahini. Fermented soy sauce. Other foods Wheat germ. The items listed above may not be a complete list of recommended foods and beverages. Contact a dietitian for more information. What foods should I avoid? Grains Whole grains. Bran cereal. Bran flour. Oats. Meats and other proteins Soybeans. Products made from soy protein. Black beans. Lentils. Mung beans. Split peas. Dairy Milk. Cream. Cheese. Yogurt. Cottage cheese. Beverages Coffee. Black tea. Red wine. Sweets and desserts Cocoa. Chocolate. Ice cream. Other foods Basil. Oregano. Large amounts of parsley. The items listed above may not be a complete list of foods and beverages to avoid. Contact a dietitian for more information. Summary  Iron is a mineral that helps your body to produce hemoglobin. Hemoglobin is a protein in red blood cells that carries oxygen to your body's tissues.  Iron is naturally found in many foods, and many foods have iron added to them (iron-fortified foods).  When you eat foods that contain iron, you should eat them with foods that are high in vitamin C. Vitamin C helps your body to absorb iron.  Certain foods and drinks prevent your body from absorbing iron properly, such as whole grains and dairy products. You should avoid eating these foods in the same meal as iron-rich foods or with iron supplements. This information is not intended to  replace advice given to you by your health care provider. Make sure you discuss any questions you have with your health care provider. Document Released: 09/20/2004 Document Revised: 01/19/2017 Document Reviewed: 01/02/2017 Elsevier Patient Education  2020 [employer].  Major Depressive Disorder, Adult Major depressive disorder (MDD) is a mental health condition. MDD often makes you feel sad, hopeless, or helpless. MDD can also cause symptoms in your body. MDD can affect your:  Work.  School.  Relationships.  Other normal activities. MDD can range from mild to very bad. It may occur once (single episode MDD). It can also occur many times (recurrent MDD). The main symptoms of MDD often include:  Feeling sad, depressed, or irritable most of the time.  Loss of interest. MDD symptoms also include:  Sleeping too much or too little.  Eating too much or too little.  A change in your weight.  Feeling tired (fatigue) or having low energy.  Feeling worthless.  Feeling guilty.  Trouble making decisions.  Trouble thinking clearly.  Thoughts of suicide or harming others.  Feeling weak.  Feeling agitated.  Keeping yourself from being around other people (isolation). Follow these instructions at home: Activity  Do these things as told by your doctor: ? Go back to your normal activities. ? Exercise regularly. ? Spend time outdoors. Alcohol  Talk with your doctor about how alcohol can affect your antidepressant medicines.  Do not drink alcohol. Or, limit how much alcohol you drink. ? This means no more than 1 drink a day for nonpregnant women and 2 drinks a day for men. One drink equals one of these:  12 oz of beer.  5 oz of wine.  1 oz of hard liquor. General instructions  Take over-the-counter and prescription medicines only as told by your doctor.  Eat a healthy diet.  Get plenty of sleep.  Find activities that you enjoy. Make time to do them.  Think  about joining a support group. Your doctor may be able to suggest a group for you.  Keep all follow-up visits as told by your doctor. This is important. Where to find more information:  [employer] on Mental Illness: ? www.nami.[place]: ? https://carter.com/  National Suicide Prevention Lifeline: ? 740-613-1574. This is free, 24-hour help. Contact a doctor if:  Your symptoms get worse.  You have new symptoms. Get help right away if:  You self-harm.  You see, hear, taste, smell, or feel things that are not present (hallucinate). If you ever feel like you may hurt yourself or others, or have thoughts about taking your own life, get help right away. You can go to your nearest emergency department or call:  Your local emergency services (911 in the [place]  A suicide crisis helpline, such as the National Suicide Prevention Lifeline: ? 217 263 9403. This is open 24 hours a day. This information is not intended to replace advice given to you by your health care provider. Make sure you discuss any questions you have with your health care provider. Document Released: 01/18/2015 Document Revised: 01/19/2017 Document Reviewed: 10/24/2015 Elsevier Patient Education  2020 [employer].

## 2018-09-19 NOTE — Progress Notes (Signed)
Subjective:     Sophia Berry is a married white 28 y.o. female and is here for a comprehensive physical exam. 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 Both are currently unemployed. Has been under more stress since pandemic. Is not physically active.  The patient reports problems - depression has returned, was treated with zoloft PP 3 years ago and it worked will for her, desires restarting.  also reports feeling very tired all the time with muscle and joint pains, menses are heay for 3 days every month (has BTL) and she feels more tired after menses. has a rash between legs- has been applying desitin as needed. .  Depression screen [facility] 2/9 09/19/2018 07/16/2015  Decreased Interest 3 1  Down, Depressed, Hopeless 3 3  PHQ - 2 Score 6 4  Altered sleeping 1 3  Tired, decreased energy 3 3  Change in appetite 1 1  Feeling bad or failure about yourself  1 3  Trouble concentrating 1 2  Moving slowly or fidgety/restless 2 1  Suicidal thoughts 0 2  PHQ-9 Score 15 19  Difficult doing work/chores Somewhat difficult Extremely dIfficult   Social History   Socioeconomic History  . Marital status: Married    Spouse name: Not on file  . Number of children: Not on file  . Years of education: Not on file  . Highest education level: Not on file  Occupational History  . Occupation: homemaker  Social Needs  . Financial resource strain: Not on file  . Food insecurity    Worry: Not on file    Inability: Not on file  . Transportation needs    Medical: Not on file    Non-medical: Not on file  Tobacco Use  . Smoking status: Never Smoker  . Smokeless tobacco: Never Used  Substance and Sexual Activity  . Alcohol use: No    Alcohol/week: 0.0 standard drinks  . Drug use: No  . Sexual activity: Yes    Partners: Male    Birth control/protection: Surgical  Lifestyle  . Physical activity    Days per week: Not on file    Minutes per session: Not on file  . Stress: Not on file   Relationships  . Social Herbalist on phone: Not on file    Gets together: Not on file    Attends religious service: Not on file    Active member of club or organization: Not on file    Attends meetings of clubs or organizations: Not on file    Relationship status: Not on file  . Intimate partner violence    Fear of current or ex partner: Not on file    Emotionally abused: Not on file    Physically abused: Not on file    Forced sexual activity: Not on file  Other Topics Concern  . Not on file  Social History Narrative  . Not on file   Health Maintenance  Topic Date Due  . PAP-Cervical Cytology Screening  02/10/2010  . PAP SMEAR-Modifier  02/10/2010  . INFLUENZA VACCINE  09/21/2018  . TETANUS/TDAP  03/17/2025  . HIV Screening  Completed    The following portions of the patient's history were reviewed and updated as appropriate: allergies, current medications, past family history, past medical history, past social history, past surgical history and problem list.  Review of Systems Pertinent items noted in HPI and remainder of comprehensive ROS otherwise negative.   Objective:    BP 104/75  Pulse 92   Ht 5\' 5"  (1.651 m)   Wt 284 lb 6.4 oz (129 kg)   LMP 08/15/2018   BMI 47.33 kg/m  General appearance: alert, cooperative, appears stated age and morbidly obese Neck: no adenopathy, no carotid bruit, no JVD, supple, symmetrical, trachea midline and thyroid not enlarged, symmetric, no tenderness/mass/nodules Lungs: clear to auscultation bilaterally Breasts: normal appearance, no masses or tenderness Heart: regular rate and rhythm, S1, S2 normal, no murmur, click, rub or gallop Abdomen: soft, non-tender; bowel sounds normal; no masses,  no organomegaly Pelvic: cervix normal in appearance, external genitalia normal, no adnexal masses or tenderness, no cervical motion tenderness, rectovaginal septum normal, uterus normal size, shape, and consistency, vagina normal  without discharge and skin in groin with tinea bilaterally down to rectum.    Assessment:    Healthy female exam.  B MI 5947 Depression Tinea of groin Fatigue Menorrhagia with regular menses     Plan:  Counseled on findings and need to treat yeast in groin, diflucan 150mg  sent in and to take one today and another pill in a week. Also sent in a prescription for lotrisone cream. Counseled on restarting zoloft, and will start at 50mg . She will let me know in 4-5 weeks how she is feeling via MyChart. Labs obtained and will follow up accordingly.   Haset [place],CNM   See After Visit Summary for Counseling Recommendations

## 2018-09-20 ENCOUNTER — Other Ambulatory Visit: Payer: Self-pay | Admitting: Obstetrics and Gynecology

## 2018-09-20 DIAGNOSIS — E559 Vitamin D deficiency, unspecified: Secondary | ICD-10-CM | POA: Insufficient documentation

## 2018-09-20 LAB — THYROID PANEL WITH TSH
Free Thyroxine Index: 1.7 (ref 1.2–4.9)
T3 Uptake Ratio: 23 % — ABNORMAL LOW (ref 24–39)
T4, Total: 7.3 ug/dL (ref 4.5–12.0)
TSH: 2.13 u[IU]/mL (ref 0.450–4.500)

## 2018-09-20 LAB — COMPREHENSIVE METABOLIC PANEL
ALT: 19 IU/L (ref 0–32)
AST: 13 IU/L (ref 0–40)
Albumin/Globulin Ratio: 2.2 (ref 1.2–2.2)
Albumin: 4.3 g/dL (ref 3.9–5.0)
Alkaline Phosphatase: 71 IU/L (ref 39–117)
BUN/Creatinine Ratio: 18 (ref 9–23)
BUN: 13 mg/dL (ref 6–20)
Bilirubin Total: 0.4 mg/dL (ref 0.0–1.2)
CO2: 22 mmol/L (ref 20–29)
Calcium: 8.9 mg/dL (ref 8.7–10.2)
Chloride: 103 mmol/L (ref 96–106)
Creatinine, Ser: 0.74 mg/dL (ref 0.57–1.00)
GFR calc Af Amer: 127 mL/min/{1.73_m2} (ref >59)
GFR calc non Af Amer: 110 mL/min/{1.73_m2} (ref >59)
Globulin, Total: 2 g/dL (ref 1.5–4.5)
Glucose: 106 mg/dL — ABNORMAL HIGH (ref 65–99)
Potassium: 4.3 mmol/L (ref 3.5–5.2)
Sodium: 142 mmol/L (ref 134–144)
Total Protein: 6.3 g/dL (ref 6.0–8.5)

## 2018-09-20 LAB — LIPID PANEL
Chol/HDL Ratio: 6.1 ratio — ABNORMAL HIGH (ref 0.0–4.4)
Cholesterol, Total: 225 mg/dL — ABNORMAL HIGH (ref 100–199)
HDL: 37 mg/dL — ABNORMAL LOW (ref >39)
LDL Calculated: 110 mg/dL — ABNORMAL HIGH (ref 0–99)
Triglycerides: 388 mg/dL — ABNORMAL HIGH (ref 0–149)
VLDL Cholesterol Cal: 78 mg/dL — ABNORMAL HIGH (ref 5–40)

## 2018-09-20 LAB — VITAMIN D 25 HYDROXY (VIT D DEFICIENCY, FRACTURES): Vit D, 25-Hydroxy: 12.6 ng/mL — ABNORMAL LOW (ref 30.0–100.0)

## 2018-09-20 LAB — B12 AND FOLATE PANEL
Folate: 13.6 ng/mL (ref >3.0)
Vitamin B-12: 381 pg/mL (ref 232–1245)

## 2018-09-20 LAB — CBC
Hematocrit: 40.1 % (ref 34.0–46.6)
Hemoglobin: 13.6 g/dL (ref 11.1–15.9)
MCH: 28.7 pg (ref 26.6–33.0)
MCHC: 33.9 g/dL (ref 31.5–35.7)
MCV: 85 fL (ref 79–97)
Platelets: 291 10*3/uL (ref 150–450)
RBC: 4.74 x10E6/uL (ref 3.77–5.28)
RDW: 13.9 % (ref 11.7–15.4)
WBC: 5.8 10*3/uL (ref 3.4–10.8)

## 2018-09-20 LAB — FERRITIN: Ferritin: 41 ng/mL (ref 15–150)

## 2018-09-20 LAB — HEMOGLOBIN A1C
Est. average glucose Bld gHb Est-mCnc: 100 mg/dL
Hgb A1c MFr Bld: 5.1 % (ref 4.8–5.6)

## 2018-09-20 MED ORDER — VITAMIN D (ERGOCALCIFEROL) 1.25 MG (50000 UNIT) PO CAPS: 50000.0000 [IU] | ORAL_CAPSULE | ORAL | 2 refills | Status: DC

## 2018-09-22 LAB — CYTOLOGY - PAP
Adequacy: ABSENT
Chlamydia: NEGATIVE
Diagnosis: NEGATIVE
Neisseria Gonorrhea: NEGATIVE

## 2019-01-15 ENCOUNTER — Telehealth: Payer: Self-pay

## 2019-01-15 ENCOUNTER — Telehealth: Payer: Self-pay | Admitting: Family

## 2019-01-15 ENCOUNTER — Encounter (INDEPENDENT_AMBULATORY_CARE_PROVIDER_SITE_OTHER): Payer: Self-pay

## 2019-01-15 DIAGNOSIS — Z20822 Contact with and (suspected) exposure to covid-19: Secondary | ICD-10-CM

## 2019-01-15 DIAGNOSIS — Z20828 Contact with and (suspected) exposure to other viral communicable diseases: Secondary | ICD-10-CM

## 2019-01-15 NOTE — Telephone Encounter (Signed)
Patient advise on temperature and change in appetite per protocol:   If appetite becomes worse: encourage patient to drink fluids as tolerated, work their way up to bland solid food such as crackers, pretzels, soup, bread or applesauce and boiled starches.   If patient is unable to tolerate any foods or liquids, notify PCP.   IF PATIENT DEVELOPS SEVERE VOMITING (MORE THAN 6 TIMES A DAY AND OR >8 HOURS) AND/OR SEVERE ABDOMINAL PAIN ADVISE PATIENT TO CALL 911 AND SEEK TREATMENT IN ED   Temperature is the same and is noted to be less than 100.4: Continue to monitor at home.   Advise patient to stay hydrated, push oral fluids if able, and advise pt. to avoid using multiple blankets and layer of clothing to prevent overheating.   Avoid over use of anti-pyretic medications for fevers less than 101 degrees Fahrenheit.   Fever helps fight infection.   Worsening temperature, treat if temperature is > 101: treat with OTC anti-fever medications (Tylenol and/or Ibuprofen)   May alternate Tylenol and Ibuprofen every 3 hours as needed for fever greater than 101. Example: Tylenol at 9AM, Ibuprofen at 12PM, Tylenol at 3PM, Ibuprofen at 6PM, Tylenol at 9PM, Ibuprofen at 12AM, Tylenol at 3AM, Ibuprofen at 6AM. Then restart rotation with Tylenol at 9AM.   If fever remains for greater than 3 days, notify PCP.   If fever becomes greater than 103 and unable to reduce with over the counter medication, contact PCP.   Patient states that her fever is 101.3 and that she doesn't have much of a appetite. Patient understanding the plan of care and will follow treatment.

## 2019-01-15 NOTE — Progress Notes (Signed)
E-Visit for Corona Virus Screening   Your current symptoms could be consistent with the coronavirus.  Many health care providers can now test patients at their office but not all are.  [facility] has multiple testing sites. For information on our COVID testing locations and hours go to [email or web address]  Please quarantine yourself while awaiting your test results.  We are enrolling you in our MyChart Home [facility] for COVID19 . Daily you will receive a questionnaire within the MyChart website. Our COVID 19 response team willl be monitoriing your responses daily. Please continue good preventive care measures, including:  frequent hand-washing, avoid touching your face, cover coughs/sneezes, stay out of crowds and keep a 6 foot distance from others.    Approximately 5 minutes was spent documenting and reviewing patient's chart.   You can go to one of the testing sites listed below, while they are opened (see hours). You do not need an order and will stay in your car during the test. You do need to self isolate until your results return and if positive 14 days from when your symptoms started and until you are 3 days fever free.   Testing Locations (Monday - Friday, 10 a.m. - 3 p.m.) . [place]: [facility] at [facility], [address] [place], [place]  . [place]: [address], [address] [place], [place] (entrance off [employer])  . [facility]: (Closed each Monday): Testing site relocated to the short stay covered drive at [facility]. (Use the [facility] entrance to [facility] next to [facility].)  COVID-19 is a respiratory illness with symptoms that are similar to the flu. Symptoms are typically mild to moderate, but there have been cases of severe illness and death due to the virus. The following symptoms may appear 2-14 days after exposure: . Fever . Cough . Shortness of  breath or difficulty breathing . Chills . Repeated shaking with chills . Muscle pain . Headache . Sore throat . New loss of taste or smell . Fatigue . Congestion or runny nose . Nausea or vomiting . Diarrhea  If you develop fever/cough/breathlessness, please stay home for 10 days with improving symptoms and until you have had 24 hours of no fever (without taking a fever reducer).  Go to the nearest hospital ED for assessment if fever/cough/breathlessness are severe or illness seems like a threat to life.  It is vitally important that if you feel that you have an infection such as this virus or any other virus that you stay home and away from places where you may spread it to others.  You should avoid contact with people age 20 and older.   You should wear a mask or cloth face covering over your nose and mouth if you must be around other people or animals, including pets (even at home). Try to stay at least 6 feet away from other people. This will protect the people around you.   You may also take acetaminophen (Tylenol) as needed for fever.   Reduce your risk of any infection by using the same precautions used for avoiding the common cold or flu:  Marland Kitchen Wash your hands often with soap and warm water for at least 20 seconds.  If soap and water are not readily available, use an alcohol-based hand sanitizer with at least 60% alcohol.  . If coughing or sneezing, cover your mouth and nose by coughing or sneezing into the elbow areas of your  shirt or coat, into a tissue or into your sleeve (not your hands). . Avoid shaking hands with others and consider head nods or verbal greetings only. . Avoid touching your eyes, nose, or mouth with unwashed hands.  . Avoid close contact with people who are sick. . Avoid places or events with large numbers of people in one location, like concerts or sporting events. . Carefully consider travel plans you have or are making. . If you are planning any travel  outside or inside the [place], visit the CDC's Travelers' Health webpage for the latest health notices. . If you have some symptoms but not all symptoms, continue to monitor at home and seek medical attention if your symptoms worsen. . If you are having a medical emergency, call 911.  HOME CARE . Only take medications as instructed by your medical team. . Drink plenty of fluids and get plenty of rest. . A steam or ultrasonic humidifier can help if you have congestion.   GET HELP RIGHT AWAY IF YOU HAVE EMERGENCY WARNING SIGNS** FOR COVID-19. If you or someone is showing any of these signs seek emergency medical care immediately. Call 911 or proceed to your closest emergency facility if: . You develop worsening high fever. . Trouble breathing . Bluish lips or face . Persistent pain or pressure in the chest . New confusion . Inability to wake or stay awake . You cough up blood. . Your symptoms become more severe  **This list is not all possible symptoms. Contact your medical provider for any symptoms that are sever or concerning to you.   MAKE SURE YOU   Understand these instructions.  Will watch your condition.  Will get help right away if you are not doing well or get worse.  Your e-visit answers were reviewed by a board certified advanced clinical practitioner to complete your personal care plan.  Depending on the condition, your plan could have included both over the counter or prescription medications.  If there is a problem please reply once you have received a response from your provider.  Your safety is important to [place].  If you have drug allergies check your prescription carefully.    You can use MyChart to ask questions about today's visit, request a non-urgent call back, or ask for a work or school excuse for 24 hours related to this e-Visit. If it has been greater than 24 hours you will need to follow up with your provider, or enter a new e-Visit to address those concerns. You  will get an e-mail in the next two days asking about your experience.  I hope that your e-visit has been valuable and will speed your recovery. Thank you for using e-visits.

## 2019-01-16 ENCOUNTER — Encounter (INDEPENDENT_AMBULATORY_CARE_PROVIDER_SITE_OTHER): Payer: Self-pay

## 2019-01-17 ENCOUNTER — Encounter (INDEPENDENT_AMBULATORY_CARE_PROVIDER_SITE_OTHER): Payer: Self-pay

## 2019-01-17 ENCOUNTER — Telehealth: Payer: Self-pay

## 2019-01-17 NOTE — Telephone Encounter (Signed)
Patient has been advise on temperature and appetite.   Temperature is the same and is noted to be less than 100.4: Continue to monitor at home.   Advise patient to stay hydrated, push oral fluids if able, and advise pt. to avoid using multiple blankets and layer of clothing to prevent overheating.   Avoid over use of anti-pyretic medications for fevers less than 101 degrees Fahrenheit.   Fever helps fight infection.   Worsening temperature, treat if temperature is > 101: treat with OTC anti-fever medications (Tylenol and/or Ibuprofen)   May alternate Tylenol and Ibuprofen every 3 hours as needed for fever greater than 101. Example: Tylenol at 9AM, Ibuprofen at 12PM, Tylenol at 3PM, Ibuprofen at 6PM, Tylenol at 9PM, Ibuprofen at 12AM, Tylenol at 3AM, Ibuprofen at 6AM. Then restart rotation with Tylenol at 9AM.   If fever remains for greater than 3 days, notify PCP.   If fever becomes greater than 103 and unable to reduce with over the counter medication, contact PCP.   If appetite becomes worse: encourage patient to drink fluids as tolerated, work their way up to bland solid food such as crackers, pretzels, soup, bread or applesauce and boiled starches.   If patient is unable to tolerate any foods or liquids, notify PCP.   IF PATIENT DEVELOPS SEVERE VOMITING (MORE THAN 6 TIMES A DAY AND OR >8 HOURS) AND/OR SEVERE ABDOMINAL PAIN ADVISE PATIENT TO CALL 911 AND SEEK TREATMENT IN ED  Patient states that she has has a fever for 3 days and has been able to eat a little bit.   Patient will follow up with PCP.

## 2019-01-17 NOTE — Telephone Encounter (Signed)
Patient has been advise on temp and appetite.   Temperature is the same and is noted to be less than 100.4: Continue to monitor at home.   Advise patient to stay hydrated, push oral fluids if able, and advise pt. to avoid using multiple blankets and layer of clothing to prevent overheating.   Avoid over use of anti-pyretic medications for fevers less than 101 degrees Fahrenheit.   Fever helps fight infection.   Worsening temperature, treat if temperature is > 101: treat with OTC anti-fever medications (Tylenol and/or Ibuprofen)   May alternate Tylenol and Ibuprofen every 3 hours as needed for fever greater than 101. Example: Tylenol at 9AM, Ibuprofen at 12PM, Tylenol at 3PM, Ibuprofen at 6PM, Tylenol at 9PM, Ibuprofen at 12AM, Tylenol at 3AM, Ibuprofen at 6AM. Then restart rotation with Tylenol at 9AM.   If fever remains for greater than 3 days, notify PCP.   If fever becomes greater than 103 and unable to reduce with over the counter medication, contact PCP.   If appetite becomes worse: encourage patient to drink fluids as tolerated, work their way up to bland solid food such as crackers, pretzels, soup, bread or applesauce and boiled starches.   If patient is unable to tolerate any foods or liquids, notify PCP.   IF PATIENT DEVELOPS SEVERE VOMITING (MORE THAN 6 TIMES A DAY AND OR >8 HOURS) AND/OR SEVERE ABDOMINAL PAIN ADVISE PATIENT TO CALL 911 AND SEEK TREATMENT IN ED  Patient states that she has has a fever for 3 days and has been able to eat a little bit.   Patient will follow up with PCP.

## 2019-01-17 NOTE — Telephone Encounter (Signed)
Left a vm for patient to callback regarding mychart questionnaire  

## 2019-01-18 ENCOUNTER — Encounter (INDEPENDENT_AMBULATORY_CARE_PROVIDER_SITE_OTHER): Payer: Self-pay

## 2019-01-19 ENCOUNTER — Telehealth: Payer: Self-pay

## 2019-01-19 ENCOUNTER — Encounter (INDEPENDENT_AMBULATORY_CARE_PROVIDER_SITE_OTHER): Payer: Self-pay

## 2019-01-19 NOTE — Telephone Encounter (Signed)
Pt sent the following message via MyChart: Hi Shanyiah! I called and left a message earlier but wanted to get this message out to you. If cough remains the same or gets better, continue taking OTC cough medication, drinking warm fluids and sucking on cough drops or candy. If cough worsens or if you have shortness of breath at rest or if the OTC cough medication is not working or your cough is keeping you up at night, please call your doctor. Also if you are bringing up yellow or green phlegm please call your doctor. If you beginning to have shortness of breath at rest, or any difficulty breathing, please call 911 and let them know that you need to go to the ED as soon as possible. If you have any further questions please call [phone]. Thank you and hope you feel better real soon!

## 2019-01-19 NOTE — Telephone Encounter (Signed)
Called pt to discuss worsening cough and diarrhea. Both numbers and not working numbers. Will send message via [facility].

## 2019-01-21 ENCOUNTER — Encounter (INDEPENDENT_AMBULATORY_CARE_PROVIDER_SITE_OTHER): Payer: Self-pay

## 2019-01-22 ENCOUNTER — Encounter (INDEPENDENT_AMBULATORY_CARE_PROVIDER_SITE_OTHER): Payer: Self-pay

## 2019-01-23 ENCOUNTER — Encounter (INDEPENDENT_AMBULATORY_CARE_PROVIDER_SITE_OTHER): Payer: Self-pay

## 2019-01-24 ENCOUNTER — Encounter (INDEPENDENT_AMBULATORY_CARE_PROVIDER_SITE_OTHER): Payer: Self-pay

## 2019-03-31 DIAGNOSIS — H5213 Myopia, bilateral: Secondary | ICD-10-CM | POA: Diagnosis not present

## 2019-04-01 DIAGNOSIS — H5213 Myopia, bilateral: Secondary | ICD-10-CM | POA: Diagnosis not present

## 2019-04-29 ENCOUNTER — Encounter: Payer: Self-pay | Admitting: Certified Nurse Midwife

## 2019-04-29 ENCOUNTER — Telehealth: Payer: Self-pay | Admitting: Certified Nurse Midwife

## 2019-04-29 ENCOUNTER — Ambulatory Visit (INDEPENDENT_AMBULATORY_CARE_PROVIDER_SITE_OTHER): Payer: Medicaid Other | Admitting: Certified Nurse Midwife

## 2019-04-29 ENCOUNTER — Other Ambulatory Visit: Payer: Self-pay

## 2019-04-29 ENCOUNTER — Telehealth: Payer: Self-pay

## 2019-04-29 ENCOUNTER — Other Ambulatory Visit: Payer: Self-pay | Admitting: Certified Nurse Midwife

## 2019-04-29 VITALS — BP 98/64 | HR 85 | Ht 65.0 in | Wt 282.2 lb

## 2019-04-29 DIAGNOSIS — E669 Obesity, unspecified: Secondary | ICD-10-CM | POA: Diagnosis not present

## 2019-04-29 DIAGNOSIS — Z23 Encounter for immunization: Secondary | ICD-10-CM | POA: Diagnosis not present

## 2019-04-29 DIAGNOSIS — F419 Anxiety disorder, unspecified: Secondary | ICD-10-CM

## 2019-04-29 DIAGNOSIS — Z6841 Body Mass Index (BMI) 40.0 and over, adult: Secondary | ICD-10-CM

## 2019-04-29 MED ORDER — CITALOPRAM HYDROBROMIDE 20 MG PO TABS: 20.0000 mg | ORAL_TABLET | Freq: Every day | ORAL | 1 refills | Status: DC

## 2019-04-29 NOTE — Progress Notes (Signed)
Medication Management Clinic Visit Note  Patient: Sophia Berry PJASNKNLZ MRN: [id number] Date of Birth: [birth year] PCP: [facility]   Ali N Rowles 29 y.o. female presents for follow up visit on cholesterol and medication management. She state she had cholesterol done 08/2018 and it was elevated . She was supposed to have it rechecked . She state she has made dietary changes but struggles with weight loss. She also presents today for medication management she has was on zoloft for treatment of anxiety and depression. She state she has delt with this for several years but it has progressed after each child she has had. Melody started her on zoloft after her 3rd child. She feels like the medication has not worked for her and has since stopped it but feels like she needs to be on something. She has reached out to [facility] for counseling but has a five week wait , she is supposed to call back to make that appointment. She feels like she is eaily agitated and "little things set her off" , she finds herself arguing with her husband and being abrupt with her children. She is "wound up and cant relax", which is affecting her daily life. She stopped the zoloft because she felt like it did not work and she would like to try something else.    BP 98/64   Pulse 85   Ht 5\' 5"  (1.651 m)   Wt 282 lb 4 oz (128 kg)   LMP 04/05/2019 (Approximate)   BMI 46.97 kg/m   Patient Information   Past Medical History:  Diagnosis Date  . Anemia   . GERD (gastroesophageal reflux disease)    with preg.  04/07/2019 Hyperlipidemia   . Migraines   . Shortness of breath dyspnea       Past Surgical History:  Procedure Laterality Date  . CESAREAN SECTION    . CESAREAN SECTION WITH BILATERAL TUBAL LIGATION N/A 05/31/2015   Procedure: REPEAT CESAREAN SECTION WITH BILATERAL TUBAL LIGATION;  Surgeon: 07/31/2015, MD;  Location: [facility] ORS;  Service: Obstetrics;  Laterality: N/A;     Family History  Problem  Relation Age of Onset  . Asthma Mother   . Migraines Mother   . Cancer Maternal Grandmother        mat. great grandmother/unsure of what kind  . Breast cancer Maternal Aunt        Great  . Diabetes Other        mat great grandmother  . Heart failure Other        mat great grandmother    New Diagnoses (since last visit):   Family Support: Good  Spouse       Social History   Substance and Sexual Activity  Alcohol Use No  . Alcohol/week: 0.0 standard drinks      Social History   Tobacco Use  Smoking Status Never Smoker  Smokeless Tobacco Never Used      Health Maintenance  Topic Date Due  . PAP SMEAR-Modifier  09/18/2021  . TETANUS/TDAP  03/17/2025  . INFLUENZA VACCINE  Completed  . HIV Screening  Completed   GAD 7 : Generalized Anxiety Score 04/29/2019  Nervous, Anxious, on Edge 3  Control/stop worrying 3  Worry too much - different things 3  Trouble relaxing 3  Restless 3  Easily annoyed or irritable 3  Afraid - awful might happen 3  Total GAD 7 Score 21  Anxiety Difficulty Extremely difficult    PHQ9 SCORE ONLY 04/29/2019  09/19/2018 07/16/2015  Score 23 15 19      Assessment and Plan: Encouraged pt to make appointment at [facility]. Orders placed for celexa 20 mg daily.Discussed that she has failed one medication and will try alternative if she fails second recommend referral to psychiatry. She agrees Discussed follow up in 4 wks. Encouraged exercise and time for herself. Repeat cholesterol today. Will follow up with results.   I attest more than 50% of visit spent reviewing history, discussing cholesterol , diet , exercise for management. Discussing history of anxiety and depression , medications used in the past, current symptoms, and treatment options. Face to face time 15 min.   Philip Aspen, CNM

## 2019-04-29 NOTE — Telephone Encounter (Signed)
Jasmine December,   Please let her know that I put in the order.   Thanks,  Doreene Burke, CNM

## 2019-04-29 NOTE — Telephone Encounter (Signed)
Patient called asking for a referral for psychiatrist at [facility].  Does patient need to make an appt for a referral or see their PCP?

## 2019-04-29 NOTE — Patient Instructions (Signed)
High Cholesterol  High cholesterol is a condition in which the blood has high levels of a white, waxy, fat-like substance (cholesterol). The human body needs small amounts of cholesterol. The liver makes all the cholesterol that the body needs. Extra (excess) cholesterol comes from the food that we eat. Cholesterol is carried from the liver by the blood through the blood vessels. If you have high cholesterol, deposits (plaques) may build up on the walls of your blood vessels (arteries). Plaques make the arteries narrower and stiffer. Cholesterol plaques increase your risk for heart attack and stroke. Work with your health care provider to keep your cholesterol levels in a healthy range. What increases the risk? This condition is more likely to develop in people who:  Eat foods that are high in animal fat (saturated fat) or cholesterol.  Are overweight.  Are not getting enough exercise.  Have a family history of high cholesterol. What are the signs or symptoms? There are no symptoms of this condition. How is this diagnosed? This condition may be diagnosed from the results of a blood test.  If you are older than age 20, your health care provider may check your cholesterol every 4-6 years.  You may be checked more often if you already have high cholesterol or other risk factors for heart disease. The blood test for cholesterol measures:  "Bad" cholesterol (LDL cholesterol). This is the main type of cholesterol that causes heart disease. The desired level for LDL is less than 100.  "Good" cholesterol (HDL cholesterol). This type helps to protect against heart disease by cleaning the arteries and carrying the LDL away. The desired level for HDL is 60 or higher.  Triglycerides. These are fats that the body can store or burn for energy. The desired number for triglycerides is lower than 150.  Total cholesterol. This is a measure of the total amount of cholesterol in your blood, including LDL  cholesterol, HDL cholesterol, and triglycerides. A healthy number is less than 200. How is this treated? This condition is treated with diet changes, lifestyle changes, and medicines. Diet changes  This may include eating more whole grains, fruits, vegetables, nuts, and fish.  This may also include cutting back on red meat and foods that have a lot of added sugar. Lifestyle changes  Changes may include getting at least 40 minutes of aerobic exercise 3 times a week. Aerobic exercises include walking, biking, and swimming. Aerobic exercise along with a healthy diet can help you maintain a healthy weight.  Changes may also include quitting smoking. Medicines  Medicines are usually given if diet and lifestyle changes have failed to reduce your cholesterol to healthy levels.  Your health care provider may prescribe a statin medicine. Statin medicines have been shown to reduce cholesterol, which can reduce the risk of heart disease. Follow these instructions at home: Eating and drinking If told by your health care provider:  Eat chicken (without skin), fish, veal, shellfish, ground turkey breast, and round or loin cuts of red meat.  Do not eat fried foods or fatty meats, such as hot dogs and salami.  Eat plenty of fruits, such as apples.  Eat plenty of vegetables, such as broccoli, potatoes, and carrots.  Eat beans, peas, and lentils.  Eat grains such as barley, rice, couscous, and bulgur wheat.  Eat pasta without cream sauces.  Use skim or nonfat milk, and eat low-fat or nonfat yogurt and cheeses.  Do not eat or drink whole milk, cream, ice cream, egg yolks,   or hard cheeses.  Do not eat stick margarine or tub margarines that contain trans fats (also called partially hydrogenated oils).  Do not eat saturated tropical oils, such as coconut oil and palm oil.  Do not eat cakes, cookies, crackers, or other baked goods that contain trans fats.  General instructions  Exercise as  directed by your health care provider. Increase your activity level with activities such as gardening, walking, and taking the stairs.  Take over-the-counter and prescription medicines only as told by your health care provider.  Do not use any products that contain nicotine or tobacco, such as cigarettes and e-cigarettes. If you need help quitting, ask your health care provider.  Keep all follow-up visits as told by your health care provider. This is important. Contact a health care provider if:  You are struggling to maintain a healthy diet or weight.  You need help to start on an exercise program.  You need help to stop smoking. Get help right away if:  You have chest pain.  You have trouble breathing. This information is not intended to replace advice given to you by your health care provider. Make sure you discuss any questions you have with your health care provider. Document Revised: 02/09/2017 Document Reviewed: 08/07/2015 Elsevier Patient Education  2020 [employer] Persistent Depressive Disorder  Persistent depressive disorder (PDD) is a mental health condition. PDD causes symptoms of low-level depression for 2 years or longer. It may also be called long-term (chronic) depression or dysthymia. PDD may include episodes of more severe depression that last for about 2 weeks (major depressive disorder or MDD). PDD can affect the way you think, feel, and sleep. This condition may also affect your relationships. You may be more likely to get sick if you have PDD. Symptoms of PDD occur for most of the day and may include:  Feeling tired (fatigue).  Low energy.  Eating too much or too little.  Sleeping too much or too little.  Feeling restless or agitated.  Feeling hopeless.  Feeling worthless or guilty.  Feeling worried or nervous (anxiety).  Trouble concentrating or making decisions.  Low self-esteem.  A negative way of looking at things (outlook).  Not being  able to have fun or feel pleasure.  Avoiding interacting with people.  Getting angry or annoyed easily (irritability).  Acting aggressive or angry. Follow these instructions at home: Activity  Go back to your normal activities as told by your doctor.  Exercise regularly as told by your doctor. General instructions  Take over-the-counter and prescription medicines only as told by your doctor.  Do not drink alcohol. Or, limit how much alcohol you drink to no more than 1 drink a day for nonpregnant women and 2 drinks a day for men. One drink equals 12 oz of beer, 5 oz of wine, or 1 oz of hard liquor. Alcohol can affect any antidepressant medicines you are taking. Talk with your doctor about your alcohol use.  Eat a healthy diet and get plenty of sleep.  Find activities that you enjoy each day.  Consider joining a support group. Your doctor may be able to suggest a support group.  Keep all follow-up visits as told by your doctor. This is important. Where to find more information [employer] on Mental Illness  www.nami.org [place] [employer] of Mental Health  http://www.maynard.net/ National Suicide Prevention Lifeline  (206)575-3941).  This is free, 24-hour help. Contact a doctor if:  Your symptoms get worse.  You have new symptoms.  You have trouble sleeping or doing your daily activities. Get help right away if:  You self-harm.  You have serious thoughts about hurting yourself or others.  You see, hear, taste, smell, or feel things that are not there (hallucinate). This information is not intended to replace advice given to you by your health care provider. Make sure you discuss any questions you have with your health care provider. Document Revised: 01/19/2017 Document Reviewed: 10/01/2015 Elsevier Patient Education  [employer]. Generalized Anxiety Disorder, Adult Generalized anxiety disorder (GAD) is a mental health disorder. People with this  condition constantly worry about everyday events. Unlike normal anxiety, worry related to GAD is not triggered by a specific event. These worries also do not fade or get better with time. GAD interferes with life functions, including relationships, work, and school. GAD can vary from mild to severe. People with severe GAD can have intense waves of anxiety with physical symptoms (panic attacks). What are the causes? The exact cause of GAD is not known. What increases the risk? This condition is more likely to develop in:  Women.  People who have a family history of anxiety disorders.  People who are very shy.  People who experience very stressful life events, such as the death of a loved one.  People who have a very stressful family environment. What are the signs or symptoms? People with GAD often worry excessively about many things in their lives, such as their health and family. They may also be overly concerned about:  Doing well at work.  Being on time.  Natural disasters.  Friendships. Physical symptoms of GAD include:  Fatigue.  Muscle tension or having muscle twitches.  Trembling or feeling shaky.  Being easily startled.  Feeling like your heart is pounding or racing.  Feeling out of breath or like you cannot take a deep breath.  Having trouble falling asleep or staying asleep.  Sweating.  Nausea, diarrhea, or irritable bowel syndrome (IBS).  Headaches.  Trouble concentrating or remembering facts.  Restlessness.  Irritability. How is this diagnosed? Your health care provider can diagnose GAD based on your symptoms and medical history. You will also have a physical exam. The health care provider will ask specific questions about your symptoms, including how severe they are, when they started, and if they come and go. Your health care provider may ask you about your use of alcohol or drugs, including prescription medicines. Your health care provider may  refer you to a mental health specialist for further evaluation. Your health care provider will do a thorough examination and may perform additional tests to rule out other possible causes of your symptoms. To be diagnosed with GAD, a person must have anxiety that:  Is out of his or her control.  Affects several different aspects of his or her life, such as work and relationships.  Causes distress that makes him or her unable to take part in normal activities.  Includes at least three physical symptoms of GAD, such as restlessness, fatigue, trouble concentrating, irritability, muscle tension, or sleep problems. Before your health care provider can confirm a diagnosis of GAD, these symptoms must be present more days than they are not, and they must last for six months or longer. How is this treated? The following therapies are usually used to treat GAD:  Medicine. Antidepressant medicine is usually prescribed for long-term daily control. Antianxiety medicines may be added in severe cases, especially when panic attacks occur.  Talk therapy (psychotherapy). Certain types  of talk therapy can be helpful in treating GAD by providing support, education, and guidance. Options include: ? Cognitive behavioral therapy (CBT). People learn coping skills and techniques to ease their anxiety. They learn to identify unrealistic or negative thoughts and behaviors and to replace them with positive ones. ? Acceptance and commitment therapy (ACT). This treatment teaches people how to be mindful as a way to cope with unwanted thoughts and feelings. ? Biofeedback. This process trains you to manage your body's response (physiological response) through breathing techniques and relaxation methods. You will work with a therapist while machines are used to monitor your physical symptoms.  Stress management techniques. These include yoga, meditation, and exercise. A mental health specialist can help determine which  treatment is best for you. Some people see improvement with one type of therapy. However, other people require a combination of therapies. Follow these instructions at home:  Take over-the-counter and prescription medicines only as told by your health care provider.  Try to maintain a normal routine.  Try to anticipate stressful situations and allow extra time to manage them.  Practice any stress management or self-calming techniques as taught by your health care provider.  Do not punish yourself for setbacks or for not making progress.  Try to recognize your accomplishments, even if they are small.  Keep all follow-up visits as told by your health care provider. This is important. Contact a health care provider if:  Your symptoms do not get better.  Your symptoms get worse.  You have signs of depression, such as: ? A persistently sad, cranky, or irritable mood. ? Loss of enjoyment in activities that used to bring you joy. ? Change in weight or eating. ? Changes in sleeping habits. ? Avoiding friends or family members. ? Loss of energy for normal tasks. ? Feelings of guilt or worthlessness. Get help right away if:  You have serious thoughts about hurting yourself or others. If you ever feel like you may hurt yourself or others, or have thoughts about taking your own life, get help right away. You can go to your nearest emergency department or call:  Your local emergency services (911 in the [place]  A suicide crisis helpline, such as the National Suicide Prevention Lifeline at 628-062-5857. This is open 24 hours a day. Summary  Generalized anxiety disorder (GAD) is a mental health disorder that involves worry that is not triggered by a specific event.  People with GAD often worry excessively about many things in their lives, such as their health and family.  GAD may cause physical symptoms such as restlessness, trouble concentrating, sleep problems, frequent sweating,  nausea, diarrhea, headaches, and trembling or muscle twitching.  A mental health specialist can help determine which treatment is best for you. Some people see improvement with one type of therapy. However, other people require a combination of therapies. This information is not intended to replace advice given to you by your health care provider. Make sure you discuss any questions you have with your health care provider. Document Revised: 01/19/2017 Document Reviewed: 12/28/2015 Elsevier Patient Education  2020 [employer].

## 2019-04-29 NOTE — Progress Notes (Signed)
Orders placed for referral to psychiatry per pt request.   Doreene Burke, CNM

## 2019-04-29 NOTE — Telephone Encounter (Signed)
mychart message sent to patient regarding referral

## 2019-04-30 LAB — LIPID PANEL
Chol/HDL Ratio: 4.3 ratio (ref 0.0–4.4)
Cholesterol, Total: 199 mg/dL (ref 100–199)
HDL: 46 mg/dL (ref >39)
LDL Chol Calc (NIH): 112 mg/dL — ABNORMAL HIGH (ref 0–99)
Triglycerides: 238 mg/dL — ABNORMAL HIGH (ref 0–149)
VLDL Cholesterol Cal: 41 mg/dL — ABNORMAL HIGH (ref 5–40)

## 2019-05-19 ENCOUNTER — Other Ambulatory Visit: Payer: Self-pay

## 2019-05-19 ENCOUNTER — Ambulatory Visit (INDEPENDENT_AMBULATORY_CARE_PROVIDER_SITE_OTHER): Payer: Medicaid Other | Admitting: Licensed Clinical Social Worker

## 2019-05-19 ENCOUNTER — Encounter: Payer: Self-pay | Admitting: Licensed Clinical Social Worker

## 2019-05-19 DIAGNOSIS — F411 Generalized anxiety disorder: Secondary | ICD-10-CM | POA: Diagnosis not present

## 2019-05-19 DIAGNOSIS — O99345 Other mental disorders complicating the puerperium: Secondary | ICD-10-CM

## 2019-05-19 DIAGNOSIS — F53 Postpartum depression: Secondary | ICD-10-CM | POA: Diagnosis not present
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
## 2019-05-27 ENCOUNTER — Encounter: Payer: Medicaid Other | Admitting: Certified Nurse Midwife

## 2019-06-02 ENCOUNTER — Ambulatory Visit: Payer: Medicaid Other | Admitting: Licensed Clinical Social Worker

## 2019-06-03 ENCOUNTER — Encounter: Payer: Medicaid Other | Admitting: Certified Nurse Midwife

## 2019-06-03 DIAGNOSIS — H5213 Myopia, bilateral: Secondary | ICD-10-CM | POA: Diagnosis not present

## 2019-06-06 ENCOUNTER — Encounter: Payer: Self-pay | Admitting: Certified Nurse Midwife

## 2019-06-25 ENCOUNTER — Other Ambulatory Visit: Payer: Self-pay | Admitting: Certified Nurse Midwife

## 2019-06-28 ENCOUNTER — Other Ambulatory Visit: Payer: Self-pay | Admitting: Certified Nurse Midwife

## 2019-07-01 ENCOUNTER — Telehealth: Payer: Self-pay | Admitting: Certified Nurse Midwife

## 2019-07-01 NOTE — Telephone Encounter (Signed)
This prescription that this patient was needing refilled was for Citalopram.

## 2019-07-01 NOTE — Telephone Encounter (Signed)
What is this for, there is nothing written except that it is a call for medication refill. What medication??  Thanks  Pattricia Boss

## 2019-07-09 ENCOUNTER — Encounter: Payer: Self-pay | Admitting: Family Medicine

## 2019-07-09 ENCOUNTER — Other Ambulatory Visit: Payer: Self-pay

## 2019-07-09 ENCOUNTER — Ambulatory Visit (INDEPENDENT_AMBULATORY_CARE_PROVIDER_SITE_OTHER): Payer: Medicaid Other | Admitting: Family Medicine

## 2019-07-09 VITALS — BP 132/69 | HR 80 | Temp 97.7°F | Ht 64.5 in | Wt 289.0 lb

## 2019-07-09 DIAGNOSIS — R739 Hyperglycemia, unspecified: Secondary | ICD-10-CM

## 2019-07-09 DIAGNOSIS — Z0000 Encounter for general adult medical examination without abnormal findings: Secondary | ICD-10-CM | POA: Diagnosis not present

## 2019-07-09 DIAGNOSIS — Z7689 Persons encountering health services in other specified circumstances: Secondary | ICD-10-CM

## 2019-07-09 DIAGNOSIS — F419 Anxiety disorder, unspecified: Secondary | ICD-10-CM | POA: Diagnosis not present

## 2019-07-09 DIAGNOSIS — Z8349 Family history of other endocrine, nutritional and metabolic diseases: Secondary | ICD-10-CM

## 2019-07-09 DIAGNOSIS — R519 Headache, unspecified: Secondary | ICD-10-CM | POA: Insufficient documentation

## 2019-07-09 DIAGNOSIS — G44219 Episodic tension-type headache, not intractable: Secondary | ICD-10-CM

## 2019-07-09 MED ORDER — CITALOPRAM HYDROBROMIDE 20 MG PO TABS: 20.0000 mg | ORAL_TABLET | Freq: Every day | ORAL | 1 refills | Status: DC

## 2019-07-09 MED ORDER — TOPIRAMATE 25 MG PO TABS: 25.0000 mg | ORAL_TABLET | Freq: Two times a day (BID) | ORAL | 1 refills | Status: DC

## 2019-07-09 NOTE — Patient Instructions (Signed)
I have sent in a refill to the pharmacy for your citalopram 20mg .  Begin taking that once daily.  I have also sent in a prescription for Topamax 25mg .  Begin taking 1 tablet in the morning and once in the evening for the first week and then increase to 2 tablets in the morning and 2 tablets in the evening.  We will see you back in 2 weeks to re-evaluate your headaches and if you have had improvement with this daily headache preventative.  The following recommendations are helpful adjuncts for helping rebalance your mood.  Eat a nourishing diet. Ensure adequate intake of calories, protein, carbs, fat, vitamins, and minerals. Prioritize whole foods at each meal, including meats, vegetables, fruits, nuts and seeds, etc.   Avoid inflammatory and/or "junk" foods, such as sugar, omega-6 fats, refined grains, chemicals, and preservatives are common in packaged and prepared foods. Minimize or completely avoid these ingredients and stick to whole foods with little to no additives. Cook from scratch as much as possible for more control over what you eat  Get enough sleep. Poor sleep is significantly associated with depression and anxiety. Make 7-9 hours of sleep nightly a top priority  Exercise appropriately. Exercise is known to improve brain functioning and boost mood. Aim for 30 minutes of daily physical activity. Avoid "overtraining," which can cause mental disturbances  Assess your light exposure. Not enough natural light during the day and too much artificial light can have a major impact on your mood. Get outside as often as possible during daylight hours. Minimize light exposure after dark and avoid the use of electronics that give off blue light before bed  Manage your stress.  Use daily stress management techniques such as meditation, yoga, or mindfulness to retrain your brain to respond differently to stress. Try deep breathing to deactivate your "fight or flight" response.  There are many of  sources with apps like Headspace, Calm or a variety of YouTube videos (videos from have guided meditation)  Prioritize your social life. Work on building social support with new friends or improve current relationships. Consider getting a pet that allows for companionship, social interaction, and physical touch. Try volunteering or joining a faith-based community to increase your sense of purpose  4-7-8 breathing technique at bedtime: breathe in to count of 4, hold breath for count of 7, exhale for count of 8; do 3-5 times for letting go of overactive thoughts  Take time to play Unstructured "play" time can help reduce anxiety and depression Options for play include music, games, sports, dance, art, etc.  Try to add daily omega 3 fatty acids, magnesium, B complex, and balanced amino acid supplements to help improve mood and anxiety.  We will plan to see you back in 2 weeks for anxiety and headache follow up  You will receive a survey after today's visit either digitally by e-mail or paper by USPS mail. Your experiences and feedback matter to .  Please respond so we know how we are doing as we provide care for you.  Call Romero Belling with any questions/concerns/needs.  It is my goal to be available to you for your health concerns.  Thanks for choosing me to be a partner in your healthcare needs!  [place], FNP-C Family Nurse Practitioner [facility] Phone: [phone]

## 2019-07-09 NOTE — Assessment & Plan Note (Signed)
Reports chronic anxiety/depression since birth of first child, has been taking citalopram 20mg  and reports tolerating well with good reduction of symptoms but has been off of this medication for approximately 3 weeks due to changing of medical providers at [facility] health location.  Discussed will restart on this medication and follow up in 2 weeks for re-evaluation.  Plan: 1. Restart citalopram 20mg  daily 2. Mood handout given for review 3. Follow up in 2 weeks for re-evaluation

## 2019-07-09 NOTE — Assessment & Plan Note (Signed)
Reports long standing history of daily headaches, recently exacerbated with being off of citalopram.  Reports are always on the right side of temporal and occipital regions with some visual auras.  Has not been on headache preventatives in the past and with reports of daily headaches, could benefit from this.  Patient in agreement to beginning daily headache preventative.  Plan: 1. Begin topamax 25mg  twice per day for 1 week then increase to topamax 50mg  twice per day for second week. 2. Return to clinic in 2 weeks for re-evaluation.

## 2019-07-09 NOTE — Progress Notes (Signed)
Subjective:    Patient ID: KALEB LINQUIST, female    DOB: 1990-07-14, 28 y.o.   MRN: [id number]  Megean Fabio [id number] is a 29 y.o. female presenting on 07/09/2019 for Establish Care (Anxiety. Pt state she have been off her citalpram x 3 weeks, because her OB-GYN  would not refill her medication. She state that she recently establish with Behavioral Health, but physician she saw went out on Maternity leave) and Headache (frequent headache, pt think it could be associated to being off her medication. )   HPI  Previous PCP was with [facility].  Records will not be requested, as are accessible in Care Everywhere.  Past medical, family, and surgical history reviewed w/ pt.  Ms. Coupland presents for establishing with our practice for primary care services, reports was previously on citalopram but has been out of medication for approximately 3 weeks, is requesting to restart this medication.  Reports has helped with her anxiety and functioning with her activities of daily living.    Reports history of daily headaches that are chronic but worsened with being off of the citalopram.  Reports headaches are always on the right side, temporal and occipital region, some visual auras at times, but are self limiting and sometimes resolve on their own.  Reports not being on a headache preventative in the past and open to trying preventative medications at this time.  Denies dizziness, lightheadedness, visual changes, "worst headache of her life", or sudden onset.   Depression screen [facility] 2/9 07/09/2019 04/29/2019 09/19/2018  Decreased Interest 2 3 3   Down, Depressed, Hopeless 3 3 3   PHQ - 2 Score 5 6 6   Altered sleeping 2 2 1   Tired, decreased energy 2 3 3   Change in appetite 3 3 1   Feeling bad or failure about yourself  3 3 1   Trouble concentrating 3 3 1   Moving slowly or fidgety/restless 2 2 2   Suicidal thoughts 2 1 0  PHQ-9 Score 22 23 15   Difficult doing work/chores Very difficult Very  difficult Somewhat difficult    Social History   Tobacco Use  . Smoking status: Never Smoker  . Smokeless tobacco: Never Used  Substance Use Topics  . Alcohol use: No    Alcohol/week: 0.0 standard drinks  . Drug use: No    Review of Systems  Constitutional: Negative.   HENT: Negative.   Eyes: Negative.   Respiratory: Negative.   Cardiovascular: Negative.   Gastrointestinal: Negative.   Endocrine: Negative.   Genitourinary: Negative.   Musculoskeletal: Negative.   Skin: Negative.   Allergic/Immunologic: Negative.   Neurological: Positive for headaches. Negative for dizziness, tremors, seizures, syncope, facial asymmetry, speech difficulty, weakness, light-headedness and numbness.  Hematological: Negative.   Psychiatric/Behavioral: Positive for dysphoric mood. Negative for agitation, behavioral problems, confusion, decreased concentration, hallucinations, self-injury, sleep disturbance and suicidal ideas. The patient is nervous/anxious. The patient is not hyperactive.    Per HPI unless specifically indicated above     Objective:    BP 132/69 (BP Location: Left Arm, Patient Position: Sitting, Cuff Size: Normal)   Pulse 80   Temp 97.7 F (36.5 C) (Temporal)   Ht 5' 4.5" (1.638 m)   Wt 289 lb (131.1 kg)   LMP 06/01/2019   BMI 48.84 kg/m   Wt Readings from Last 3 Encounters:  07/09/19 289 lb (131.1 kg)  04/29/19 282 lb 4 oz (128 kg)  09/19/18 284 lb 6.4 oz (129 kg)    Physical Exam Vitals reviewed.  Constitutional:      General: She is not in acute distress.    Appearance: Normal appearance. She is well-developed and well-groomed. She is obese. She is not ill-appearing or toxic-appearing.  HENT:     Head: Normocephalic.     Nose:     Comments: Lesia Sago is in place, covering mouth and nose  Eyes:     General: Lids are normal. Vision grossly intact. No visual field deficit.       Right eye: No discharge.        Left eye: No discharge.     Extraocular Movements:  Extraocular movements intact.     Conjunctiva/sclera: Conjunctivae normal.     Pupils: Pupils are equal, round, and reactive to light.  Cardiovascular:     Rate and Rhythm: Normal rate and regular rhythm.     Pulses: Normal pulses.     Heart sounds: Normal heart sounds. No murmur. No friction rub. No gallop.   Pulmonary:     Effort: Pulmonary effort is normal. No respiratory distress.     Breath sounds: Normal breath sounds.  Abdominal:     General: Bowel sounds are normal. There is no distension.     Palpations: Abdomen is soft. There is no mass.     Tenderness: There is no abdominal tenderness.  Musculoskeletal:     Right lower leg: No edema.     Left lower leg: No edema.  Skin:    General: Skin is warm and dry.     Capillary Refill: Capillary refill takes less than 2 seconds.  Neurological:     General: No focal deficit present.     Mental Status: She is alert and oriented to person, place, and time.     Cranial Nerves: No cranial nerve deficit, dysarthria or facial asymmetry.     Sensory: No sensory deficit.     Motor: No weakness.     Coordination: Coordination normal.     Gait: Gait normal.     Deep Tendon Reflexes: Reflexes normal.  Psychiatric:        Attention and Perception: Attention and perception normal.        Mood and Affect: Affect normal. Mood is anxious and depressed.        Speech: Speech normal.        Behavior: Behavior normal. Behavior is not agitated. Behavior is cooperative.        Thought Content: Thought content normal.        Cognition and Memory: Cognition and memory normal. Cognition is not impaired. Memory is not impaired.        Judgment: Judgment normal.    Results for orders placed or performed in visit on 04/29/19  Lipid panel  Result Value Ref Range   Cholesterol, Total 199 100 - 199 mg/dL   Triglycerides 335 (H) 0 - 149 mg/dL   HDL 46 >45 mg/dL   VLDL Cholesterol Cal 41 (H) 5 - 40 mg/dL   LDL Chol Calc (NIH) 625 (H) 0 - 99 mg/dL    Chol/HDL Ratio 4.3 0.0 - 4.4 ratio      Assessment & Plan:   Problem List Items Addressed This Visit      Other   Anxiety - Primary    Reports chronic anxiety/depression since birth of first child, has been taking citalopram 20mg  and reports tolerating well with good reduction of symptoms but has been off of this medication for approximately 3 weeks due to [facility] of [facility] providers [facility].  Discussed will restart on this medication and follow up in 2 weeks for re-evaluation.  Plan: 1. Restart citalopram 20mg  daily 2. Mood handout given for review 3. Follow up in 2 weeks for re-evaluation      Relevant Medications   citalopram (CELEXA) 20 MG tablet   Encounter to establish care with new doctor    Establishment for PCP services with [facility] on 07/09/2019.        Headache    Reports long standing history of daily headaches, recently exacerbated with being off of citalopram.  Reports are always on the right side of temporal and occipital regions with some visual auras.  Has not been on headache preventatives in the past and with reports of daily headaches, could benefit from this.  Patient in agreement to beginning daily headache preventative.  Plan: 1. Begin topamax 25mg  twice per day for 1 week then increase to topamax 50mg  twice per day for second week. 2. Return to clinic in 2 weeks for re-evaluation.      Relevant Medications   citalopram (CELEXA) 20 MG tablet   topiramate (TOPAMAX) 25 MG tablet    Other Visit Diagnoses    Routine medical exam       Relevant Orders   CBC with Differential   COMPLETE METABOLIC PANEL WITH GFR   Family history of thyroid dysfunction       Relevant Orders   Thyroid Panel With TSH   Hyperglycemia       Relevant Orders   HgB A1c      Meds ordered this encounter  Medications  . citalopram (CELEXA) 20 MG tablet    Sig: Take 1 tablet (20 mg total) by mouth daily.    Dispense:  90 tablet    Refill:  1  . topiramate  (TOPAMAX) 25 MG tablet    Sig: Take 1 tablet (25 mg total) by mouth 2 (two) times daily.    Dispense:  60 tablet    Refill:  1      Follow up plan: Return in about 2 weeks (around 07/23/2019) for Headache & Anxiety F/U.   Harlin Rain, [place] Family Nurse Practitioner [facility] 07/09/2019, 11:08 AM

## 2019-07-09 NOTE — Assessment & Plan Note (Signed)
Establishment for PCP services with [facility] on 07/09/2019.

## 2019-07-23 ENCOUNTER — Other Ambulatory Visit: Payer: Self-pay

## 2019-07-23 ENCOUNTER — Encounter: Payer: Self-pay | Admitting: Family Medicine

## 2019-07-23 ENCOUNTER — Ambulatory Visit (INDEPENDENT_AMBULATORY_CARE_PROVIDER_SITE_OTHER): Payer: Medicaid Other | Admitting: Family Medicine

## 2019-07-23 VITALS — BP 116/60 | HR 72 | Temp 97.7°F | Ht 64.5 in | Wt 288.8 lb

## 2019-07-23 DIAGNOSIS — Z0000 Encounter for general adult medical examination without abnormal findings: Secondary | ICD-10-CM

## 2019-07-23 DIAGNOSIS — G44219 Episodic tension-type headache, not intractable: Secondary | ICD-10-CM

## 2019-07-23 DIAGNOSIS — E781 Pure hyperglyceridemia: Secondary | ICD-10-CM | POA: Diagnosis not present

## 2019-07-23 DIAGNOSIS — Z1329 Encounter for screening for other suspected endocrine disorder: Secondary | ICD-10-CM | POA: Diagnosis not present

## 2019-07-23 DIAGNOSIS — F419 Anxiety disorder, unspecified: Secondary | ICD-10-CM | POA: Diagnosis not present

## 2019-07-23 DIAGNOSIS — R0683 Snoring: Secondary | ICD-10-CM | POA: Diagnosis not present

## 2019-07-23 DIAGNOSIS — R635 Abnormal weight gain: Secondary | ICD-10-CM | POA: Diagnosis not present

## 2019-07-23 DIAGNOSIS — E559 Vitamin D deficiency, unspecified: Secondary | ICD-10-CM | POA: Diagnosis not present

## 2019-07-23 LAB — POCT URINALYSIS DIPSTICK
Bilirubin, UA: NEGATIVE
Blood, UA: NEGATIVE
Glucose, UA: NEGATIVE
Ketones, UA: NEGATIVE
Leukocytes, UA: NEGATIVE
Nitrite, UA: NEGATIVE
Protein, UA: NEGATIVE
Spec Grav, UA: 1.015 (ref 1.010–1.025)
Urobilinogen, UA: 0.2 E.U./dL
pH, UA: 5 (ref 5.0–8.0)

## 2019-07-23 MED ORDER — FLUOXETINE HCL 20 MG PO CAPS: 20.0000 mg | ORAL_CAPSULE | Freq: Every day | ORAL | 1 refills | Status: DC

## 2019-07-23 MED ORDER — TOPIRAMATE 25 MG PO TABS: 25.0000 mg | ORAL_TABLET | Freq: Two times a day (BID) | ORAL | 1 refills | Status: DC

## 2019-07-23 NOTE — Assessment & Plan Note (Signed)
Status unknown.  Recheck labs.  Followup after labs.  

## 2019-07-23 NOTE — Assessment & Plan Note (Signed)
Reports decrease in effectiveness of citalopram since beginning topamax.  Has been on sertraline in the past with unknown response to treatment.  Is open to switching to fluoxetine today for trial.  No SI/HI.  Mood handout given at last visit and reviewed.  Plan: 1. Stop citalopram 20mg  as of today and BEGIN fluoxetine 20mg  daily tomorrow 2. Discussed can take up to 4-6 weeks for improvement in symptoms 3. Follow up in 4 weeks 4. To call with any questions/concerns/needs if needed before next visit.

## 2019-07-23 NOTE — Patient Instructions (Addendum)
Continue topamax 25m twice per day for headache prevention.  We are going to change your celexa 238mto fluoxetine 2036maily.  Can stop the celexa tomorrow and begin the fluoxetine 76m29mmorrow.  As we discussed, this medication can take up to 4-6 weeks to have full effect, we will plan to meet again in 4 weeks to check your progress with this medication.  I have put in a referral for a sleep study due to your scoring on the [facility] with your snoring and feeling tired/fatigued/sleepy throughout the day.  You should hear something within 1 week, if you haven't heard from the sleep center or our referral coordinator, please let me know and I will follow up with them.  The following recommendations are helpful adjuncts for helping rebalance your mood.  Eat a nourishing diet. Ensure adequate intake of calories, protein, carbs, fat, vitamins, and minerals. Prioritize whole foods at each meal, including meats, vegetables, fruits, nuts and seeds, etc.   Avoid inflammatory and/or "junk" foods, such as sugar, omega-6 fats, refined grains, chemicals, and preservatives are common in packaged and prepared foods. Minimize or completely avoid these ingredients and stick to whole foods with little to no additives. Cook from scratch as much as possible for more control over what you eat  Get enough sleep. Poor sleep is significantly associated with depression and anxiety. Make 7-9 hours of sleep nightly a top priority  Exercise appropriately. Exercise is known to improve brain functioning and boost mood. Aim for 30 minutes of daily physical activity. Avoid "overtraining," which can cause mental disturbances  Assess your light exposure. Not enough natural light during the day and too much artificial light can have a major impact on your mood. Get outside as often as possible during daylight hours. Minimize light exposure after dark and avoid the use of electronics that give off blue light before  bed  Manage your stress.  Use daily stress management techniques such as meditation, yoga, or mindfulness to retrain your brain to respond differently to stress. Try deep breathing to deactivate your "fight or flight" response.  There are many of sources with apps like Headspace, Calm or a variety of YouTube videos (videos from JasoGwynne Edingere guided meditation)  Prioritize your social life. Work on building social support with new friends or improve current relationships. Consider getting a pet that allows for companionship, social interaction, and physical touch. Try volunteering or joining a faith-based community to increase your sense of purpose  4-7-8 breathing technique at bedtime: breathe in to count of 4, hold breath for count of 7, exhale for count of 8; do 3-5 times for letting go of overactive thoughts  Take time to play Unstructured "play" time can help reduce anxiety and depression Options for play include music, games, sports, dance, art, etc.  Try to add daily omega 3 fatty acids, magnesium, B complex, and balanced amino acid supplements to help improve mood and anxiety.  Sleep hygiene is the single most effective treatment for sleep issues, but it is hard work.  Tips for a good night's sleep:  -Keep sleep environment comfortable and conducive to sleep -Keep regular sleep schedule 7 nights a week -Avoiding naps during the day -Avoiding going to bed until drowsy and ready to sleep, not trying to sleep, and not watching the clock -Get out of bed if not asleep within 15-20 minutes and returning only when drowsy -Avoiding caffeine, nicotine, alcohol, and other substances that interfere with sleep before bedtime -Take an  hour before your set bedtime and start to wind down: bath/shower, no more TV or phone (the blue light can interfere with sleeping), listen to soothing music, or meditation -No TV in your bedroom -Exercising regularly, at least 6 hours before sleep. Yoga and  Tai Chi can improve sleep quality  There are a lot of books and apps that may help guide you with any of the following:   -Progressive muscle relaxation (involves methodical tension and relaxation of different Muscle groups throughout body)  Guided imagery  -YouTube - Gwynne Edinger has free videos on YouTube that can help with meditation and some   Abdominal breathing   Over the counter sleep aid one hour before bed- and gradually wean your use over 2-4 weeks  Some examples are : *Melatonin 5-10 mg *Sleepology (Can find on [employer]) taken according to packaging directions  There are a few online evidence based online programs, unfortunately they are not free.   Developed by a sleep expert who created a drug-free program for insomnia proven more effective than sleeping pills.  www.cbtforinsomnia.com Sleepio is an evidence-based digital sleep improvement program   www.sleepio.com SHUTi is designed to actively help retrain your body and mind for great sleep through six engaging Cognitive Behavioral Therapy for Insomnia strategy and learning sessions  [email or web address]  We will plan to see you back in 4 weeks for anxiety follow up  You will receive a survey after today's visit either digitally by e-mail or paper by [employer]. Your experiences and feedback matter to [place].  Please respond so we know how we are doing as we provide care for you.  Call us with any questions/concerns/needs.  It is my goal to be available to you for your health concerns.  Thanks for choosing me to be a partner in your healthcare needs!  Harlin Rain, FNP-C Family Nurse Practitioner [facility] Phone: [phone]

## 2019-07-23 NOTE — Progress Notes (Signed)
Subjective:    Patient ID: Sophia Berry, female    DOB: 07-27-1990, 28 y.o.   MRN: [id number]  Sophia Berry [id number] is a 29 y.o. female presenting on 07/23/2019 for Headache (pt notice improvement since taking the topamax ) and Anxiety (pt feel like her anxiety have not improved since taking both medications. She state that the Citalopram seemed to work better without the Topamax)   HPI  Sophia Berry presents to clinic for re-evaluation of her headaches since beginning topamax and her anxiety since being on her citalopram.  Reports that her headaches have been relieved since beginning this medication with no headaches to report since last office visit.  Denies dizziness, lightheadedness, visual changes, headaches/migraines.  Reports that she believes her anxiety is slightly worsened with taking the topamax with the citalopram.  Is interested in staying with the topamax and changing her antidepressant.  Has tried sertraline in the past with unknown results, is open to trying fluoxetine today.  Denies any SI/HI.   Reports has some sleep disturbances, with snoring, feeling tired/fatigued throughout the day, unknown if any apneas at night, no treatment for hypertension.  STOPBANG 4/8 screening  Depression screen [facility] 2/9 07/23/2019 07/09/2019 04/29/2019  Decreased Interest 2 2 3   Down, Depressed, Hopeless 1 3 3   PHQ - 2 Score 3 5 6   Altered sleeping 1 2 2   Tired, decreased energy 1 2 3   Change in appetite 1 3 3   Feeling bad or failure about yourself  1 3 3   Trouble concentrating 3 3 3   Moving slowly or fidgety/restless 3 2 2   Suicidal thoughts 2 2 1   PHQ-9 Score 15 22 23   Difficult doing work/chores Very difficult Very difficult Very difficult    Social History   Tobacco Use  . Smoking status: Never Smoker  . Smokeless tobacco: Never Used  Substance Use Topics  . Alcohol use: No    Alcohol/week: 0.0 standard drinks  . Drug use: No    Review of Systems  Constitutional: Negative.     HENT: Negative.   Eyes: Negative.   Respiratory: Negative.   Cardiovascular: Negative.   Gastrointestinal: Negative.   Endocrine: Negative.   Genitourinary: Negative.   Musculoskeletal: Negative.   Skin: Negative.   Allergic/Immunologic: Negative.   Neurological: Negative.   Hematological: Negative.   Psychiatric/Behavioral: Positive for sleep disturbance. Negative for agitation, behavioral problems, confusion, decreased concentration, dysphoric mood, hallucinations, self-injury and suicidal ideas. The patient is nervous/anxious. The patient is not hyperactive.    Per HPI unless specifically indicated above     Objective:    BP 116/60 (BP Location: Left Arm, Patient Position: Sitting, Cuff Size: Normal)   Pulse 72   Temp 97.7 F (36.5 C) (Temporal)   Ht 5' 4.5" (1.638 m)   Wt 288 lb 12.8 oz (131 kg)   BMI 48.81 kg/m   Wt Readings from Last 3 Encounters:  07/23/19 288 lb 12.8 oz (131 kg)  07/09/19 289 lb (131.1 kg)  04/29/19 282 lb 4 oz (128 kg)    Physical Exam Vitals reviewed.  Constitutional:      General: She is not in acute distress.    Appearance: Normal appearance. She is well-developed and well-groomed. She is obese. She is not ill-appearing or toxic-appearing.  HENT:     Head: Normocephalic.     Nose:     Comments: is in place, covering mouth and nose  Eyes:     General: Lids are normal. Vision grossly intact.  Right eye: No discharge.        Left eye: No discharge.     Extraocular Movements: Extraocular movements intact.     Conjunctiva/sclera: Conjunctivae normal.     Pupils: Pupils are equal, round, and reactive to light.  Cardiovascular:     Rate and Rhythm: Normal rate and regular rhythm.     Pulses: Normal pulses.     Heart sounds: Normal heart sounds. No murmur. No friction rub. No gallop.   Pulmonary:     Effort: Pulmonary effort is normal. No respiratory distress.     Breath sounds: Normal breath sounds.  Musculoskeletal:      Right lower leg: No edema.     Left lower leg: No edema.  Skin:    General: Skin is warm and dry.     Capillary Refill: Capillary refill takes less than 2 seconds.  Neurological:     General: No focal deficit present.     Mental Status: She is alert and oriented to person, place, and time.     Cranial Nerves: No cranial nerve deficit.     Sensory: No sensory deficit.     Motor: No weakness.     Coordination: Coordination normal.     Gait: Gait normal.  Psychiatric:        Attention and Perception: Attention and perception normal.        Mood and Affect: Mood is anxious. Affect is flat.        Speech: Speech normal.        Behavior: Behavior normal. Behavior is cooperative.        Thought Content: Thought content normal.        Cognition and Memory: Cognition and memory normal.        Judgment: Judgment normal.    Results for orders placed or performed in visit on 07/23/19  POCT Urinalysis Dipstick  Result Value Ref Range   Color, UA yellow    Clarity, UA clear    Glucose, UA Negative Negative   Bilirubin, UA negative    Ketones, UA negative    Spec Grav, UA 1.015 1.010 - 1.025   Blood, UA negative    pH, UA 5.0 5.0 - 8.0   Protein, UA Negative Negative   Urobilinogen, UA 0.2 0.2 or 1.0 E.U./dL   Nitrite, UA negative    Leukocytes, UA Negative Negative   Appearance     Odor        Assessment & Plan:   Problem List Items Addressed This Visit      Other   Vitamin D deficiency    Status unknown.  Recheck labs. Followup after labs.       Relevant Orders   VITAMIN D 25 Hydroxy (Vit-D Deficiency, Fractures)   Anxiety    Reports decrease in effectiveness of citalopram since beginning topamax.  Has been on sertraline in the past with unknown response to treatment.  Is open to switching to fluoxetine today for trial.  No SI/HI.  Mood handout given at last visit and reviewed.  Plan: 1. Stop citalopram 20mg  as of today and BEGIN fluoxetine 20mg  daily tomorrow 2.  Discussed can take up to 4-6 weeks for improvement in symptoms 3. Follow up in 4 weeks 4. To call with any questions/concerns/needs if needed before next visit.      Relevant Medications   FLUoxetine (PROZAC) 20 MG capsule   Headache    Reported improvement in headaches from near daily to no headaches since  beginning topamax 25mg  twice daily.  Will continue with headache prevention medication plan.  Plan: 1. Continue topamax 25mg  twice daily 2. Follow up in 3 months      Relevant Medications   topiramate (TOPAMAX) 25 MG tablet   FLUoxetine (PROZAC) 20 MG capsule   Snoring    History of snoring with excessive daytime sleepiness.  Reports is snoring, does stay tired/fatigued/sleepy throughout the day, unknown if having unwitnessed apneas at night.  Reports Mom has OSA.  Plan: 1. Sleep study ordered 2. Follow up after sleep study completed.      Relevant Orders   Nocturnal polysomnography (NPSG)    Other Visit Diagnoses    Weight gain    -  Primary   Relevant Orders   Thyroid Panel With TSH   Routine medical exam       Relevant Orders   POCT Urinalysis Dipstick (Completed)   CBC with Differential   COMPLETE METABOLIC PANEL WITH GFR   Thyroid Panel With TSH   Screening for thyroid disorder       Relevant Orders   Thyroid Panel With TSH   Hypertriglyceridemia       Relevant Orders   Lipid Profile      Meds ordered this encounter  Medications  . topiramate (TOPAMAX) 25 MG tablet    Sig: Take 1 tablet (25 mg total) by mouth 2 (two) times daily.    Dispense:  180 tablet    Refill:  1  . FLUoxetine (PROZAC) 20 MG capsule    Sig: Take 1 capsule (20 mg total) by mouth daily.    Dispense:  90 capsule    Refill:  1      Follow up plan: Return in about 4 weeks (around 08/20/2019) for Anxiety medication f/u.   , FNP Family Nurse Practitioner [facility] 07/23/2019, 10:28 AM

## 2019-07-23 NOTE — Assessment & Plan Note (Signed)
History of snoring with excessive daytime sleepiness.  Reports is snoring, does stay tired/fatigued/sleepy throughout the day, unknown if having unwitnessed apneas at night.  Reports Mom has OSA.  Plan: 1. Sleep study ordered 2. Follow up after sleep study completed.

## 2019-07-23 NOTE — Assessment & Plan Note (Signed)
Reported improvement in headaches from near daily to no headaches since beginning topamax 25mg  twice daily.  Will continue with headache prevention medication plan.  Plan: 1. Continue topamax 25mg  twice daily 2. Follow up in 3 months

## 2019-07-24 DIAGNOSIS — Z0000 Encounter for general adult medical examination without abnormal findings: Secondary | ICD-10-CM | POA: Diagnosis not present

## 2019-07-24 DIAGNOSIS — Z1329 Encounter for screening for other suspected endocrine disorder: Secondary | ICD-10-CM | POA: Diagnosis not present

## 2019-07-24 DIAGNOSIS — R635 Abnormal weight gain: Secondary | ICD-10-CM | POA: Diagnosis not present

## 2019-07-25 ENCOUNTER — Other Ambulatory Visit: Payer: Self-pay | Admitting: Family Medicine

## 2019-07-25 DIAGNOSIS — E559 Vitamin D deficiency, unspecified: Secondary | ICD-10-CM

## 2019-07-25 LAB — COMPLETE METABOLIC PANEL WITH GFR
AG Ratio: 1.8 (calc) (ref 1.0–2.5)
ALT: 32 U/L — ABNORMAL HIGH (ref 6–29)
AST: 25 U/L (ref 10–30)
Albumin: 4.6 g/dL (ref 3.6–5.1)
Alkaline phosphatase (APISO): 55 U/L (ref 31–125)
BUN: 14 mg/dL (ref 7–25)
CO2: 24 mmol/L (ref 20–32)
Calcium: 9.8 mg/dL (ref 8.6–10.2)
Chloride: 108 mmol/L (ref 98–110)
Creat: 0.88 mg/dL (ref 0.50–1.10)
GFR, Est African American: 102 mL/min/{1.73_m2} (ref >=60)
GFR, Est Non African American: 88 mL/min/{1.73_m2} (ref >=60)
Globulin: 2.6 g/dL (calc) (ref 1.9–3.7)
Glucose, Bld: 78 mg/dL (ref 65–99)
Potassium: 4.3 mmol/L (ref 3.5–5.3)
Sodium: 140 mmol/L (ref 135–146)
Total Bilirubin: 0.4 mg/dL (ref 0.2–1.2)
Total Protein: 7.2 g/dL (ref 6.1–8.1)

## 2019-07-25 LAB — LIPID PANEL
Cholesterol: 204 mg/dL — ABNORMAL HIGH (ref <200)
HDL: 56 mg/dL (ref >=50)
LDL Cholesterol (Calc): 123 mg/dL (calc) — ABNORMAL HIGH
Non-HDL Cholesterol (Calc): 148 mg/dL (calc) — ABNORMAL HIGH (ref <130)
Total CHOL/HDL Ratio: 3.6 (calc) (ref <5.0)
Triglycerides: 131 mg/dL (ref <150)

## 2019-07-25 LAB — CBC WITH DIFFERENTIAL/PLATELET
Absolute Monocytes: 488 cells/uL (ref 200–950)
Basophils Absolute: 20 cells/uL (ref 0–200)
Basophils Relative: 0.3 %
Eosinophils Absolute: 78 cells/uL (ref 15–500)
Eosinophils Relative: 1.2 %
HCT: 42.2 % (ref 35.0–45.0)
Hemoglobin: 13.4 g/dL (ref 11.7–15.5)
Lymphs Abs: 2022 cells/uL (ref 850–3900)
MCH: 27.9 pg (ref 27.0–33.0)
MCHC: 31.8 g/dL — ABNORMAL LOW (ref 32.0–36.0)
MCV: 87.7 fL (ref 80.0–100.0)
MPV: 9.8 fL (ref 7.5–12.5)
Monocytes Relative: 7.5 %
Neutro Abs: 3894 cells/uL (ref 1500–7800)
Neutrophils Relative %: 59.9 %
Platelets: 253 10*3/uL (ref 140–400)
RBC: 4.81 10*6/uL (ref 3.80–5.10)
RDW: 13.3 % (ref 11.0–15.0)
Total Lymphocyte: 31.1 %
WBC: 6.5 10*3/uL (ref 3.8–10.8)

## 2019-07-25 LAB — THYROID PANEL WITH TSH
Free Thyroxine Index: 2.3 (ref 1.4–3.8)
T3 Uptake: 31 % (ref 22–35)
T4, Total: 7.5 ug/dL (ref 5.1–11.9)
TSH: 1.35 mIU/L

## 2019-07-25 LAB — VITAMIN D 25 HYDROXY (VIT D DEFICIENCY, FRACTURES): Vit D, 25-Hydroxy: 10 ng/mL — ABNORMAL LOW (ref 30–100)

## 2019-07-25 MED ORDER — VITAMIN D (ERGOCALCIFEROL) 1.25 MG (50000 UNIT) PO CAPS: 50000.0000 [IU] | ORAL_CAPSULE | ORAL | 0 refills | Status: AC

## 2019-09-23 ENCOUNTER — Encounter: Payer: Self-pay | Admitting: Family Medicine

## 2019-09-23 ENCOUNTER — Other Ambulatory Visit: Payer: Self-pay | Admitting: Family Medicine

## 2019-09-24 ENCOUNTER — Other Ambulatory Visit: Payer: Self-pay

## 2019-09-24 ENCOUNTER — Ambulatory Visit: Payer: Medicaid Other | Admitting: Family Medicine

## 2019-09-24 ENCOUNTER — Encounter: Payer: Self-pay | Admitting: Family Medicine

## 2019-09-24 VITALS — BP 121/59 | HR 80 | Temp 98.3°F | Resp 17 | Ht 64.5 in | Wt 282.0 lb

## 2019-09-24 DIAGNOSIS — F419 Anxiety disorder, unspecified: Secondary | ICD-10-CM | POA: Diagnosis not present

## 2019-09-24 DIAGNOSIS — G43909 Migraine, unspecified, not intractable, without status migrainosus: Secondary | ICD-10-CM | POA: Insufficient documentation

## 2019-09-24 DIAGNOSIS — G43809 Other migraine, not intractable, without status migrainosus: Secondary | ICD-10-CM

## 2019-09-24 MED ORDER — FLUOXETINE HCL 20 MG PO TABS: ORAL_TABLET | ORAL | 0 refills | Status: DC

## 2019-09-24 NOTE — Assessment & Plan Note (Signed)
History of migraines, was recently placed on topamax 25mg  BID.  Reports good relief of symptoms with this treatment, interested in continuing.  Plan: 1. Continue topamax 25mg  BID daily for migraine/headache prevention 2. RTC in 3 months

## 2019-09-24 NOTE — Patient Instructions (Signed)
I have increased your fluoxetine from 20mg  daily to 30mg  for the next 7 days, then to 40mg  daily for the next 3 months.  Be sure to keep in touch if you feel that you need to make any medication changes before your next visit.  The following recommendations are helpful adjuncts for helping rebalance your mood.  Eat a nourishing diet. Ensure adequate intake of calories, protein, carbs, fat, vitamins, and minerals. Prioritize whole foods at each meal, including meats, vegetables, fruits, nuts and seeds, etc.   Avoid inflammatory and/or "junk" foods, such as sugar, omega-6 fats, refined grains, chemicals, and preservatives are common in packaged and prepared foods. Minimize or completely avoid these ingredients and stick to whole foods with little to no additives. Cook from scratch as much as possible for more control over what you eat  Get enough sleep. Poor sleep is significantly associated with depression and anxiety. Make 7-9 hours of sleep nightly a top priority  Exercise appropriately. Exercise is known to improve brain functioning and boost mood. Aim for 30 minutes of daily physical activity. Avoid "overtraining," which can cause mental disturbances  Assess your light exposure. Not enough natural light during the day and too much artificial light can have a major impact on your mood. Get outside as often as possible during daylight hours. Minimize light exposure after dark and avoid the use of electronics that give off blue light before bed  Manage your stress.  Use daily stress management techniques such as meditation, yoga, or mindfulness to retrain your brain to respond differently to stress. Try deep breathing to deactivate your "fight or flight" response.  There are many of sources with apps like Headspace, Calm or a variety of YouTube videos (videos from have guided meditation)  Prioritize your social life. Work on building social support with new friends or improve current  relationships. Consider getting a pet that allows for companionship, social interaction, and physical touch. Try volunteering or joining a faith-based community to increase your sense of purpose  4-7-8 breathing technique at bedtime: breathe in to count of 4, hold breath for count of 7, exhale for count of 8; do 3-5 times for letting go of overactive thoughts  Take time to play Unstructured "play" time can help reduce anxiety and depression Options for play include music, games, sports, dance, art, etc.  Try to add daily omega 3 fatty acids, magnesium, B complex, and balanced amino acid supplements to help improve mood and anxiety.  We will plan to see you back in 3 months for physical  You will receive a survey after today's visit either digitally by e-mail or paper by [employer] mail. Your experiences and feedback matter to .  Please respond so we know how we are doing as we provide care for you.  Call with any questions/concerns/needs.  It is my goal to be available to you for your health concerns.  Thanks for choosing me to be a partner in your healthcare needs!  Romero Belling, FNP-C Family Nurse Practitioner [facility] Phone: [phone]

## 2019-09-24 NOTE — Progress Notes (Signed)
Subjective:    Patient ID: Sophia Berry, female    DOB: 1990-12-31, 28 y.o.   MRN: [id number]  Sophia Berry is a 29 y.o. female presenting on 09/24/2019 for Anxiety (pt state that it does help but it seems like its not enough. Pt have an appt scheduled with her Therapist on Friday. )   HPI  Patient presents to clinic for follow up on her anxiety.  Reports she has been taking her fluoxetine, with some improvement in her symptoms but believes she may benefit from an increased dose.  Has an appointment with her therapist this Friday.  Has been taking her topamax for her migraines, finds she has not been having migraines, would like to continue on this prescription.  Depression screen [facility] 2/9 09/24/2019 07/23/2019 07/09/2019  Decreased Interest 2 2 2   Down, Depressed, Hopeless 2 1 3   PHQ - 2 Score 4 3 5   Altered sleeping 2 1 2   Tired, decreased energy 2 1 2   Change in appetite 3 1 3   Feeling bad or failure about yourself  2 1 3   Trouble concentrating 3 3 3   Moving slowly or fidgety/restless 2 3 2   Suicidal thoughts 2 2 2   PHQ-9 Score 20 15 22   Difficult doing work/chores Extremely dIfficult Very difficult Very difficult    Social History   Tobacco Use  . Smoking status: Never Smoker  . Smokeless tobacco: Never Used  Vaping Use  . Vaping Use: Never used  Substance Use Topics  . Alcohol use: No    Alcohol/week: 0.0 standard drinks  . Drug use: No    Review of Systems  Constitutional: Negative.   HENT: Negative.   Eyes: Negative.   Respiratory: Negative.   Cardiovascular: Negative.   Gastrointestinal: Negative.   Endocrine: Negative.   Genitourinary: Negative.   Musculoskeletal: Negative.   Skin: Negative.   Allergic/Immunologic: Negative.   Neurological: Negative.   Hematological: Negative.   Psychiatric/Behavioral: Positive for dysphoric mood. Negative for agitation, behavioral problems, confusion, decreased concentration, hallucinations, self-injury, sleep  disturbance and suicidal ideas. The patient is nervous/anxious. The patient is not hyperactive.    Per HPI unless specifically indicated above     Objective:    BP (!) 121/59 (BP Location: Left Arm, Patient Position: Sitting, Cuff Size: Normal)   Pulse 80   Temp 98.3 F (36.8 C) (Oral)   Resp 17   Ht 5' 4.5" (1.638 m)   Wt 282 lb (127.9 kg)   LMP 09/23/2019   SpO2 100%   BMI 47.66 kg/m   Wt Readings from Last 3 Encounters:  09/24/19 282 lb (127.9 kg)  07/23/19 288 lb 12.8 oz (131 kg)  07/09/19 289 lb (131.1 kg)    Physical Exam Vitals reviewed.  Constitutional:      General: She is not in acute distress.    Appearance: Normal appearance. She is well-developed and well-groomed. She is morbidly obese. She is not ill-appearing or toxic-appearing.  HENT:     Head: Normocephalic and atraumatic.     Nose:     Comments: is in place, covering mouth and nose. Eyes:     General: Lids are normal. Vision grossly intact.        Right eye: No discharge.        Left eye: No discharge.     Extraocular Movements: Extraocular movements intact.     Conjunctiva/sclera: Conjunctivae normal.     Pupils: Pupils are equal, round, and reactive to light.  Cardiovascular:     Rate and Rhythm: Normal rate and regular rhythm.     Pulses: Normal pulses.     Heart sounds: Normal heart sounds. No murmur heard.  No friction rub. No gallop.   Pulmonary:     Effort: Pulmonary effort is normal. No respiratory distress.     Breath sounds: Normal breath sounds.  Musculoskeletal:     Right lower leg: No edema.     Left lower leg: No edema.  Skin:    General: Skin is warm and dry.     Capillary Refill: Capillary refill takes less than 2 seconds.  Neurological:     General: No focal deficit present.     Mental Status: She is alert and oriented to person, place, and time.  Psychiatric:        Attention and Perception: Attention and perception normal.        Mood and Affect: Affect normal.  Mood is anxious.        Speech: Speech normal.        Behavior: Behavior normal. Behavior is cooperative.        Thought Content: Thought content normal.        Cognition and Memory: Cognition and memory normal.        Judgment: Judgment normal.    Results for orders placed or performed in visit on 07/23/19  CBC with Differential  Result Value Ref Range   WBC 6.5 3.8 - 10.8 Thousand/uL   RBC 4.81 3.80 - 5.10 Million/uL   Hemoglobin 13.4 11.7 - 15.5 g/dL   HCT 01.7 35 - 45 %   MCV 87.7 80.0 - 100.0 fL   MCH 27.9 27.0 - 33.0 pg   MCHC 31.8 (L) 32.0 - 36.0 g/dL   RDW 49.4 49.6 - 75.9 %   Platelets 253 140 - 400 Thousand/uL   MPV 9.8 7.5 - 12.5 fL   Neutro Abs 3,894 1,500 - 7,800 cells/uL   Lymphs Abs 2,022 850 - 3,900 cells/uL   Absolute Monocytes 488 200 - 950 cells/uL   Eosinophils Absolute 78 15 - 500 cells/uL   Basophils Absolute 20 0 - 200 cells/uL   Neutrophils Relative % 59.9 %   Total Lymphocyte 31.1 %   Monocytes Relative 7.5 %   Eosinophils Relative 1.2 %   Basophils Relative 0.3 %  COMPLETE METABOLIC PANEL WITH GFR  Result Value Ref Range   Glucose, Bld 78 65 - 99 mg/dL   BUN 14 7 - 25 mg/dL   Creat 1.63 8.46 - 6.59 mg/dL   GFR, Est Non African American 88 > OR = 60 mL/min/1.67m2   GFR, Est African American 102 > OR = 60 mL/min/1.57m2   BUN/Creatinine Ratio NOT APPLICABLE 6 - 22 (calc)   Sodium 140 135 - 146 mmol/L   Potassium 4.3 3.5 - 5.3 mmol/L   Chloride 108 98 - 110 mmol/L   CO2 24 20 - 32 mmol/L   Calcium 9.8 8.6 - 10.2 mg/dL   Total Protein 7.2 6.1 - 8.1 g/dL   Albumin 4.6 3.6 - 5.1 g/dL   Globulin 2.6 1.9 - 3.7 g/dL (calc)   AG Ratio 1.8 1.0 - 2.5 (calc)   Total Bilirubin 0.4 0.2 - 1.2 mg/dL   Alkaline phosphatase (APISO) 55 31 - 125 U/L   AST 25 10 - 30 U/L   ALT 32 (H) 6 - 29 U/L  Thyroid Panel With TSH  Result Value Ref Range   T3 Uptake 31  22 - 35 %   T4, Total 7.5 5.1 - 11.9 mcg/dL   Free Thyroxine Index 2.3 1.4 - 3.8   TSH 1.35 mIU/L    Lipid panel  Result Value Ref Range   Cholesterol 204 (H) <200 mg/dL   HDL 56 > OR = 50 mg/dL   Triglycerides 465 <681 mg/dL   LDL Cholesterol (Calc) 123 (H) mg/dL (calc)   Total CHOL/HDL Ratio 3.6 <5.0 (calc)   Non-HDL Cholesterol (Calc) 148 (H) <130 mg/dL (calc)  VITAMIN D 25 Hydroxy (Vit-D Deficiency, Fractures)  Result Value Ref Range   Vit D, 25-Hydroxy 10 (L) 30 - 100 ng/mL  POCT Urinalysis Dipstick  Result Value Ref Range   Color, UA yellow    Clarity, UA clear    Glucose, UA Negative Negative   Bilirubin, UA negative    Ketones, UA negative    Spec Grav, UA 1.015 1.010 - 1.025   Blood, UA negative    pH, UA 5.0 5.0 - 8.0   Protein, UA Negative Negative   Urobilinogen, UA 0.2 0.2 or 1.0 E.U./dL   Nitrite, UA negative    Leukocytes, UA Negative Negative   Appearance     Odor        Assessment & Plan:   Problem List Items Addressed This Visit      Cardiovascular and Mediastinum   Migraine    History of migraines, was recently placed on topamax 25mg  BID.  Reports good relief of symptoms with this treatment, interested in continuing.  Plan: 1. Continue topamax 25mg  BID daily for migraine/headache prevention 2. RTC in 3 months      Relevant Medications   FLUoxetine (PROZAC) 20 MG tablet     Other   Anxiety - Primary    GAD7-19/PHQ9-20.  Reports improvement in symptoms but feels that could benefit from increased dosing of her fluoxetine.  Will increase from 20mg  to 40mg , titrating up over the next 2 weeks.  Has appointment with therapist for this Friday.  Discussed can place referral to another location if does not find this beneficial.  Plan: 1. INCREASE fluoxetine from 20mg  daily to 40mg  daily over the next 2 weeks 2. Review mood handout 3. Keep upcoming appointment with therapist 4. RTC in 3 months, sooner, should the need arise      Relevant Medications   FLUoxetine (PROZAC) 20 MG tablet      Meds ordered this encounter  Medications  . FLUoxetine  (PROZAC) 20 MG tablet    Sig: Take 1.5 tablets (30 mg total) by mouth daily for 7 days, THEN 2 tablets (40 mg total) daily.    Dispense:  191 tablet    Refill:  0      Follow up plan: Return in about 3 months (around 12/25/2019) for CPE.   , FNP Family Nurse Practitioner [facility] 09/24/2019, 9:59 AM

## 2019-09-24 NOTE — Assessment & Plan Note (Signed)
FGB0-21/JDB5-20.  Reports improvement in symptoms but feels that could benefit from increased dosing of her fluoxetine.  Will increase from 20mg  to 40mg , titrating up over the next 2 weeks.  Has appointment with therapist for this Friday.  Discussed can place referral to another location if does not find this beneficial.  Plan: 1. INCREASE fluoxetine from 20mg  daily to 40mg  daily over the next 2 weeks 2. Review mood handout 3. Keep upcoming appointment with therapist 4. RTC in 3 months, sooner, should the need arise

## 2019-09-26 ENCOUNTER — Other Ambulatory Visit: Payer: Self-pay

## 2019-09-26 ENCOUNTER — Encounter: Payer: Self-pay | Admitting: Licensed Clinical Social Worker

## 2019-09-26 ENCOUNTER — Ambulatory Visit (INDEPENDENT_AMBULATORY_CARE_PROVIDER_SITE_OTHER): Payer: Medicaid Other | Admitting: Licensed Clinical Social Worker

## 2019-09-26 DIAGNOSIS — F53 Postpartum depression: Secondary | ICD-10-CM

## 2019-09-26 DIAGNOSIS — O99345 Other mental disorders complicating the puerperium: Secondary | ICD-10-CM | POA: Diagnosis not present

## 2019-09-26 DIAGNOSIS — F411 Generalized anxiety disorder: Secondary | ICD-10-CM

## 2019-09-26 NOTE — Progress Notes (Signed)
Patient Location: Home  Provider Location: Home Office   Virtual Visit via Video Note  I connected with Sophia Berry on 09/26/19 at  8:00 AM EDT by a video enabled telemedicine application and verified that I am speaking with the correct person using two identifiers.   I discussed the limitations of evaluation and management by telemedicine and the availability of in person appointments. The patient expressed understanding and agreed to proceed.  THERAPY PROGRESS NOTE  Session Time: 30 Minutes  Participation Level: Active  Behavioral Response: CasualAlertAnxious and Depressed  Type of Therapy: Individual Therapy  Treatment Goals addressed: Coping  Interventions: CBT and Psychosocial Skills: Parenting   Summary: Sophia Berry is a 29 y.o. female who presents with depression and anxiety sxs. Pt identified current stressors and attempts to manage sxs of depression and anxiety. Pt reported her anxiety has not improved and does not feel ready to take a job outside the home even though she was recently offered a position which has created some friction over finances with spouse. Pt identified triggers for anxiety around parenting and reported that children are "wild and don't listen to me". Pt reported "somewhat doing better" on becoming more engaged in physical and recreation activities with family and improvement in motivation. Pt was receptive to implementing structure and reward system to assist with parenting skills that is often a source of anxiety. Pt reported she wants "to be a better mom" to her children and feels guilty for getting angry at them.  Suicidal/Homicidal: No  Therapist Response: Therapist met with patient for first session since completing CCA in March 2021. Therapist has returned from leave. Therapist and patient reviewed treatment plan, discussed progression and continued goals for update. Pt in agreement with plan. Therapist provided psychoeducation on  contingency management. Therapist assigned patient homework on communicating expectations for behavior with children and coming up with rewards by next session.  Plan: Return again in 2 weeks.  Diagnosis: Axis I: Depression, Post-Partum and Generalized Anxiety Disorder    Axis II: N/A  Follow Up Instructions:    I discussed the  treatment plan with the patient. The patient was provided an opportunity to ask questions and all were answered. The patient agreed with the plan and demonstrated an understanding of the instructions.   The patient was advised to call back or seek an in-person evaluation if the symptoms worsen or if the condition fails to improve as anticipated.  I provided 30 minutes of non-face-to-face time during this encounter.   [name], LCSW, LCAS 09/26/19

## 2019-10-02 ENCOUNTER — Other Ambulatory Visit: Payer: Self-pay | Admitting: Family Medicine

## 2019-10-02 DIAGNOSIS — F419 Anxiety disorder, unspecified: Secondary | ICD-10-CM

## 2019-10-02 MED ORDER — FLUOXETINE HCL 40 MG PO CAPS: 40.0000 mg | ORAL_CAPSULE | Freq: Every day | ORAL | 0 refills | Status: DC

## 2019-10-10 ENCOUNTER — Encounter: Payer: Self-pay | Admitting: Family Medicine

## 2019-10-10 ENCOUNTER — Ambulatory Visit (INDEPENDENT_AMBULATORY_CARE_PROVIDER_SITE_OTHER): Payer: Medicaid Other | Admitting: Licensed Clinical Social Worker

## 2019-10-10 ENCOUNTER — Encounter: Payer: Self-pay | Admitting: Licensed Clinical Social Worker

## 2019-10-10 DIAGNOSIS — F53 Postpartum depression: Secondary | ICD-10-CM

## 2019-10-10 DIAGNOSIS — F411 Generalized anxiety disorder: Secondary | ICD-10-CM

## 2019-10-10 DIAGNOSIS — O99345 Other mental disorders complicating the puerperium: Secondary | ICD-10-CM

## 2019-10-10 NOTE — Progress Notes (Signed)
Patient Location: Home  Provider Location: Home Office   Virtual Visit via Video Note  I connected with Sophia Berry on 10/10/19 at  8:00 AM EDT by a video enabled telemedicine application and verified that I am speaking with the correct person using two identifiers.   I discussed the limitations of evaluation and management by telemedicine and the availability of in person appointments. The patient expressed understanding and agreed to proceed.  THERAPY PROGRESS NOTE  Session Time: 25 Minutes  Participation Level: Active  Behavioral Response: CasualAlertAnxious  Type of Therapy: Individual Therapy  Treatment Goals addressed: Anxiety, Communication: Relationships and Coping  Interventions: CBT  Summary: [name] is a 29 y.o. female who presents with depression and anxiety sxs. Pt reported that she has tried setting more structure with her kids and implementing a reward system. Pt acknowledged process is not easy and will take time to see positive results in behaviors. Pt reported with children going back to school she can have more free time to work on herself. Pt reported continued conflict with spouse and difficulty communicating effectively. Pt described emotional maturity not matching her chronological age and connected this to "having to grow up too fast" as a teen taking care of her sister.  Suicidal/Homicidal: No  Therapist Response: Therapist met with patient for follow up session. Therapist and patient reviewed homework assignment. Therapist and patient discussed relationship stressors and processed patient's thoughts and feelings about her needs for growth. Therapist provided psychoeducation around communication tips for productive arguments and encouraged patient to keep a list of these tips somewhere family can easily see as a cue for practicing better communication. Pt was receptive.  Plan: Return again in 2 weeks.  Diagnosis: Axis I: Depression, Post-Partum  and Generalized Anxiety Disorder    Axis II: N/A  [name], LCSW, LCAS 10/10/2019

## 2019-10-24 ENCOUNTER — Encounter: Payer: Self-pay | Admitting: Licensed Clinical Social Worker

## 2019-10-24 ENCOUNTER — Other Ambulatory Visit: Payer: Self-pay

## 2019-10-24 ENCOUNTER — Ambulatory Visit (INDEPENDENT_AMBULATORY_CARE_PROVIDER_SITE_OTHER): Payer: Medicaid Other | Admitting: Licensed Clinical Social Worker

## 2019-10-24 DIAGNOSIS — F411 Generalized anxiety disorder: Secondary | ICD-10-CM

## 2019-10-24 DIAGNOSIS — O99345 Other mental disorders complicating the puerperium: Secondary | ICD-10-CM | POA: Diagnosis not present

## 2019-10-24 DIAGNOSIS — F53 Postpartum depression: Secondary | ICD-10-CM | POA: Diagnosis not present

## 2019-10-24 NOTE — Progress Notes (Signed)
Patient Location: Home  Provider Location: Home Office   Virtual Visit via Video Note  I connected with [name] on 10/24/19 at  8:00 AM EDT by a video enabled telemedicine application and verified that I am speaking with the correct person using two identifiers.   I discussed the limitations of evaluation and management by telemedicine and the availability of in person appointments. The patient expressed understanding and agreed to proceed.  THERAPY PROGRESS NOTE  Session Time: 30 Minutes  Participation Level: Active  Behavioral Response: Casual and Well GroomedAlertAnxious  Type of Therapy: Individual Therapy  Treatment Goals addressed: Anxiety and Coping  Interventions: CBT  Summary: Sophia Berry is a 29 y.o. female who presents with depression and anxiety sxs. Pt reported that communication with spouse is improving somewhat and is making an effort to step outside her comfort zone. Pt explained she is in the process of interviewing for different positions and utilizing social supports outside the family. Pt identified ruminating thoughts and has difficulty "not over thinking everything". Pt acknowledged that there is a family hx of bipolar diagnoses and wonders if that plays a role.  Suicidal/Homicidal: No  Therapist Response: Therapist met with patient for follow up session. Therapist and patient reviewed homework assignment. Therapist and patient processed thoughts and feelings and discussed coping mechanisms. Therapist provided psychoeducation around grounding techniques and engaged patient in self-soothing with the 5 senses skill. Pt was receptive.  Plan: Return again in 2 weeks.  Diagnosis: Axis I: Depression, Post-Partum and Generalized Anxiety Disorder    Axis II: N/A  [name], LCSW, LCAS 10/24/2019

## 2019-11-06 ENCOUNTER — Encounter: Payer: Self-pay | Admitting: Licensed Clinical Social Worker

## 2019-11-06 ENCOUNTER — Ambulatory Visit (INDEPENDENT_AMBULATORY_CARE_PROVIDER_SITE_OTHER): Payer: Medicaid Other | Admitting: Licensed Clinical Social Worker

## 2019-11-06 ENCOUNTER — Other Ambulatory Visit: Payer: Self-pay

## 2019-11-06 DIAGNOSIS — O99345 Other mental disorders complicating the puerperium: Secondary | ICD-10-CM

## 2019-11-06 DIAGNOSIS — F53 Postpartum depression: Secondary | ICD-10-CM | POA: Diagnosis not present

## 2019-11-06 DIAGNOSIS — F411 Generalized anxiety disorder: Secondary | ICD-10-CM | POA: Diagnosis not present

## 2019-11-06 NOTE — Progress Notes (Signed)
Patient Location: Home  Provider Location: Home Office   Virtual Visit via Video Note  I connected with [name] on 11/06/19 at  8:00 AM EDT by a video enabled telemedicine application and verified that I am speaking with the correct person using two identifiers.   I discussed the limitations of evaluation and management by telemedicine and the availability of in person appointments. The patient expressed understanding and agreed to proceed.  THERAPY PROGRESS NOTE  Session Time: 12 Minutes  Participation Level: Active  Behavioral Response: CasualAlertAnxious  Type of Therapy: Individual Therapy  Treatment Goals addressed: Anxiety and Coping  Interventions: CBT and DBT  Summary: Sophia Berry is a 29 y.o. female who presents with depression and anxiety sxs. Pt reported that she was offered a job she can perform while staying at home and will be starting next month. Pt reported it is somewhat of a relief so that she can still care for kids when home sick from school. Pt reported that she worries about her oldest daughter who is experiencing behavioral issues and believes that current environment and family dynamics might be contributing to that. Pt reported that her father-in-law will be coming to live with them for a while until he can find his own place and worries that this will only add to stressors and will mean less room for her kids to have their separate space. Pt continues to use coping skills and taking time for herself in the mornings. Pt was receptive to trying more mindfulness exercises and practicing them between now and next session. Pt interested having daughter see a provider to get back on ADHD medication and for counseling.   Suicidal/Homicidal: No  Therapist Response: Therapist met with patient for follow up session. Therapist and patient discussed family dynamics and coping with stressors. Therapist engaged patient in mindfulness exercise. Therapist to  follow up with patient on process needed for referring her daughter to [facility] for therapy and medication management.  Plan: Return again in 2 weeks.  Diagnosis: Axis I: Depression, Post-Partum and Generalized Anxiety Disorder    Axis II: N/A  [name], LCSW, LCAS 11/06/2019

## 2019-11-21 ENCOUNTER — Encounter: Payer: Self-pay | Admitting: Licensed Clinical Social Worker

## 2019-11-21 ENCOUNTER — Encounter: Payer: Self-pay | Admitting: Family Medicine

## 2019-11-21 ENCOUNTER — Other Ambulatory Visit: Payer: Self-pay

## 2019-11-21 ENCOUNTER — Ambulatory Visit (INDEPENDENT_AMBULATORY_CARE_PROVIDER_SITE_OTHER): Payer: Medicaid Other | Admitting: Family Medicine

## 2019-11-21 ENCOUNTER — Ambulatory Visit (INDEPENDENT_AMBULATORY_CARE_PROVIDER_SITE_OTHER): Payer: Medicaid Other | Admitting: Licensed Clinical Social Worker

## 2019-11-21 VITALS — BP 126/71 | HR 110 | Temp 98.8°F | Resp 18 | Ht 64.5 in | Wt 281.0 lb

## 2019-11-21 DIAGNOSIS — G43809 Other migraine, not intractable, without status migrainosus: Secondary | ICD-10-CM | POA: Diagnosis not present

## 2019-11-21 DIAGNOSIS — R6883 Chills (without fever): Secondary | ICD-10-CM | POA: Diagnosis not present

## 2019-11-21 DIAGNOSIS — F53 Postpartum depression: Secondary | ICD-10-CM | POA: Diagnosis not present

## 2019-11-21 DIAGNOSIS — F4310 Post-traumatic stress disorder, unspecified: Secondary | ICD-10-CM | POA: Diagnosis not present

## 2019-11-21 DIAGNOSIS — F419 Anxiety disorder, unspecified: Secondary | ICD-10-CM | POA: Diagnosis not present

## 2019-11-21 DIAGNOSIS — L089 Local infection of the skin and subcutaneous tissue, unspecified: Secondary | ICD-10-CM | POA: Diagnosis not present

## 2019-11-21 DIAGNOSIS — O99345 Other mental disorders complicating the puerperium: Secondary | ICD-10-CM | POA: Diagnosis not present

## 2019-11-21 DIAGNOSIS — F411 Generalized anxiety disorder: Secondary | ICD-10-CM

## 2019-11-21 MED ORDER — CEPHALEXIN 500 MG PO CAPS: 500.0000 mg | ORAL_CAPSULE | Freq: Four times a day (QID) | ORAL | 0 refills | Status: AC

## 2019-11-21 MED ORDER — BUSPIRONE HCL 10 MG PO TABS: 10.0000 mg | ORAL_TABLET | Freq: Two times a day (BID) | ORAL | 1 refills | Status: AC

## 2019-11-21 MED ORDER — FLUOXETINE HCL 40 MG PO CAPS: 40.0000 mg | ORAL_CAPSULE | Freq: Every day | ORAL | 1 refills | Status: AC

## 2019-11-21 MED ORDER — TOPIRAMATE 50 MG PO TABS: 50.0000 mg | ORAL_TABLET | Freq: Two times a day (BID) | ORAL | 1 refills | Status: AC

## 2019-11-21 NOTE — Assessment & Plan Note (Signed)
Reports lessened control of migraines with current dosage of topamax 25mg  BID.  Will increase to 50mg  BID.  Will re-evaluate control in 4 weeks.  Plan: 1. Increase topamax to 50mg  twice daily 2. RTC in 4 weeks for re-evaluation

## 2019-11-21 NOTE — Assessment & Plan Note (Signed)
Likely seconary to exposure to daughter who had viral illness recently.  Possibly COVID, no testing or immunization, new onset x 4 hours.  Not appearing to be systemic infection from skin infection, strict ER/UC precautions if symptoms progress.  Encourage COVID testing.

## 2019-11-21 NOTE — Progress Notes (Signed)
Subjective:    Patient ID: Sophia Berry, female    DOB: [birth year], 28 y.o.   MRN: [id number]  Sophia Berry is a 29 y.o. female presenting on 11/21/2019 for Cyst (on the left upper thigh x 2. Pt complains of soreness, redness. Unsure if its a bug bite. ) and Chills (pt complains of chills x 4 hrs. Concern this could be related to the cyst or her iron levels are low. )   HPI  Health Maintenance:  Sophia Berry presents to clinic today for concerns of left upper thigh discomfort, reports may have been bitten by a bug, is red, swollen and painful.  Has a new onset of chills that started approximately 4 hours ago.  Daughter was recently sick and may have caught her virus.  Not tested for COVID and not up to date on vaccination.  Has concerns for migraines that were temporarily helped with topamax 25mg  BID but has found she is not achieving adequate relief any longer.  Has concerns that her fluoxetine may not be helping, she had originally been taking 40mg  daily, but recently [facility] had filled her prescription as two 20mg  tablets.  Is requesting to return to the 40mg  daily dosing.    [facility] 11/21/2019 09/24/2019 07/23/2019  Decreased Interest 2 2 2   Down, Depressed, Hopeless 2 2 1   PHQ - 2 Score 4 4 3   Altered sleeping 3 2 1   Tired, decreased energy 3 2 1   Change in appetite 2 3 1   Feeling bad or failure about yourself  2 2 1   Trouble concentrating 2 3 3   Moving slowly or fidgety/restless 2 2 3   Suicidal thoughts 2 2 2   PHQ-9 Score 20 20 15   Difficult doing work/chores Very difficult Extremely dIfficult Very difficult    Social History   Tobacco Use  . Smoking status: Never Smoker  . Smokeless tobacco: Never Used  Vaping Use  . Vaping Use: Never used  Substance Use Topics  . Alcohol use: No    Alcohol/week: 0.0 standard drinks  . Drug use: No    Review of Systems  Constitutional: Positive for chills. Negative for activity change, appetite change,  diaphoresis, fatigue, fever and unexpected weight change.  HENT: Negative.   Eyes: Negative.   Respiratory: Negative.   Cardiovascular: Negative.   Gastrointestinal: Negative.   Endocrine: Negative.   Genitourinary: Negative.   Musculoskeletal: Negative.   Skin: Negative.        Bug bite left upper thigh  Allergic/Immunologic: Negative.   Neurological: Positive for headaches. Negative for dizziness, tremors, seizures, syncope, facial asymmetry, speech difficulty, weakness, light-headedness and numbness.  Hematological: Negative.   Psychiatric/Behavioral: Positive for dysphoric mood. Negative for agitation, behavioral problems, confusion, decreased concentration, hallucinations, self-injury, sleep disturbance and suicidal ideas. The patient is nervous/anxious. The patient is not hyperactive.    Per HPI unless specifically indicated above     Objective:    BP 126/71 (BP Location: Right Arm, Patient Position: Sitting, Cuff Size: Normal)   Pulse (!) 110   Temp 98.8 F (37.1 C) (Oral)   Resp 18   Ht 5' 4.5" (1.638 m)   Wt 281 lb (127.5 kg)   SpO2 100%   BMI 47.49 kg/m   Wt Readings from Last 3 Encounters:  11/21/19 281 lb (127.5 kg)  09/24/19 282 lb (127.9 kg)  07/23/19 288 lb 12.8 oz (131 kg)    Physical Exam Vitals and nursing note reviewed.  Constitutional:  General: She is not in acute distress.    Appearance: Normal appearance. She is well-developed and well-groomed. She is morbidly obese. She is not ill-appearing or toxic-appearing.  HENT:     Head: Normocephalic and atraumatic.     Nose:     Comments: Lesia Sago is in place, covering mouth and nose. Eyes:     General: Lids are normal. Vision grossly intact.        Right eye: No discharge.        Left eye: No discharge.     Extraocular Movements: Extraocular movements intact.     Conjunctiva/sclera: Conjunctivae normal.     Pupils: Pupils are equal, round, and reactive to light.  Cardiovascular:     Rate and  Rhythm: Normal rate and regular rhythm.     Pulses: Normal pulses.     Heart sounds: Normal heart sounds. No murmur heard.  No friction rub. No gallop.   Pulmonary:     Effort: Pulmonary effort is normal. No respiratory distress.     Breath sounds: Normal breath sounds.  Musculoskeletal:     Right lower leg: No edema.     Left lower leg: No edema.  Skin:    General: Skin is warm and dry.     Capillary Refill: Capillary refill takes less than 2 seconds.       Neurological:     General: No focal deficit present.     Mental Status: She is alert and oriented to person, place, and time.     Cranial Nerves: No cranial nerve deficit.     Gait: Gait normal.  Psychiatric:        Attention and Perception: Attention and perception normal.        Mood and Affect: Mood and affect normal.        Speech: Speech normal.        Behavior: Behavior normal. Behavior is cooperative.        Thought Content: Thought content normal.        Cognition and Memory: Cognition and memory normal.        Judgment: Judgment normal.    Results for orders placed or performed in visit on 07/23/19  CBC with Differential  Result Value Ref Range   WBC 6.5 3.8 - 10.8 Thousand/uL   RBC 4.81 3.80 - 5.10 Million/uL   Hemoglobin 13.4 11.7 - 15.5 g/dL   HCT 77.9 35 - 45 %   MCV 87.7 80.0 - 100.0 fL   MCH 27.9 27.0 - 33.0 pg   MCHC 31.8 (L) 32.0 - 36.0 g/dL   RDW 39.0 30.0 - 92.3 %   Platelets 253 140 - 400 Thousand/uL   MPV 9.8 7.5 - 12.5 fL   Neutro Abs 3,894 1,500 - 7,800 cells/uL   Lymphs Abs 2,022 850 - 3,900 cells/uL   Absolute Monocytes 488 200 - 950 cells/uL   Eosinophils Absolute 78 15 - 500 cells/uL   Basophils Absolute 20 0 - 200 cells/uL   Neutrophils Relative % 59.9 %   Total Lymphocyte 31.1 %   Monocytes Relative 7.5 %   Eosinophils Relative 1.2 %   Basophils Relative 0.3 %  COMPLETE METABOLIC PANEL WITH GFR  Result Value Ref Range   Glucose, Bld 78 65 - 99 mg/dL   BUN 14 7 - 25 mg/dL    Creat 3.00 7.62 - 2.63 mg/dL   GFR, Est Non African American 88 > OR = 60 mL/min/1.34m2   GFR, Est African American  102 > OR = 60 mL/min/1.7m2   BUN/Creatinine Ratio NOT APPLICABLE 6 - 22 (calc)   Sodium 140 135 - 146 mmol/L   Potassium 4.3 3.5 - 5.3 mmol/L   Chloride 108 98 - 110 mmol/L   CO2 24 20 - 32 mmol/L   Calcium 9.8 8.6 - 10.2 mg/dL   Total Protein 7.2 6.1 - 8.1 g/dL   Albumin 4.6 3.6 - 5.1 g/dL   Globulin 2.6 1.9 - 3.7 g/dL (calc)   AG Ratio 1.8 1.0 - 2.5 (calc)   Total Bilirubin 0.4 0.2 - 1.2 mg/dL   Alkaline phosphatase (APISO) 55 31 - 125 U/L   AST 25 10 - 30 U/L   ALT 32 (H) 6 - 29 U/L  Thyroid Panel With TSH  Result Value Ref Range   T3 Uptake 31 22 - 35 %   T4, Total 7.5 5.1 - 11.9 mcg/dL   Free Thyroxine Index 2.3 1.4 - 3.8   TSH 1.35 mIU/L  Lipid panel  Result Value Ref Range   Cholesterol 204 (H) <200 mg/dL   HDL 56 > OR = 50 mg/dL   Triglycerides 329 <924 mg/dL   LDL Cholesterol (Calc) 123 (H) mg/dL (calc)   Total CHOL/HDL Ratio 3.6 <5.0 (calc)   Non-HDL Cholesterol (Calc) 148 (H) <130 mg/dL (calc)  VITAMIN D 25 Hydroxy (Vit-D Deficiency, Fractures)  Result Value Ref Range   Vit D, 25-Hydroxy 10 (L) 30 - 100 ng/mL  POCT Urinalysis Dipstick  Result Value Ref Range   Color, UA yellow    Clarity, UA clear    Glucose, UA Negative Negative   Bilirubin, UA negative    Ketones, UA negative    Spec Grav, UA 1.015 1.010 - 1.025   Blood, UA negative    pH, UA 5.0 5.0 - 8.0   Protein, UA Negative Negative   Urobilinogen, UA 0.2 0.2 or 1.0 E.U./dL   Nitrite, UA negative    Leukocytes, UA Negative Negative   Appearance     Odor        Assessment & Plan:   Problem List Items Addressed This Visit      Cardiovascular and Mediastinum   Migraine    Reports lessened control of migraines with current dosage of topamax 25mg  BID.  Will increase to 50mg  BID.  Will re-evaluate control in 4 weeks.  Plan: 1. Increase topamax to 50mg  twice daily 2. RTC in 4  weeks for re-evaluation      Relevant Medications   topiramate (TOPAMAX) 50 MG tablet   FLUoxetine (PROZAC) 40 MG capsule     Musculoskeletal and Integument   Skin infection - Primary    Left upper thigh with skin infection, no fluctuation on palpation, appears to be bug bite that has become infected, red, warm, tender to palpation.  Will treat with Cephalexin 500mg  4x per day for the next 4 days.  Strict ER/UC precautions.  Plan: 1. Begin cephalexin 500mg  4x per day for the next 5 days 2. If any worsening of symptoms to proceed to urgent care or the ER for evaluation 3. RTC PRN      Relevant Medications   cephALEXin (KEFLEX) 500 MG capsule     Other   Anxiety    PHQ9-20/GAD7-21, previously PHQ9-20/GAD7-19.  Has concerns her fluoxetine may not be working as well since has given her two 20mg  tablets to take daily instead of a 40mg  tablet.  Will contact [facility] and request this changed back to a 40mg   tablet.  Will continue buspar 20mg  BID.  Currently in therapy an following with [facility].  Plan: 1. Continue Buspar 20mg  BID 2. Continue fluoxetine 40mg  daily 3. Keep appointment with [facility] for therapy/counseling 4. RTC in 4 weeks      Relevant Medications   busPIRone (BUSPAR) 10 MG tablet   FLUoxetine (PROZAC) 40 MG capsule   Chills    Likely seconary to exposure to daughter who had viral illness recently.  Possibly COVID, no testing or immunization, new onset x 4 hours.  Not appearing to be systemic infection from skin infection, strict ER/UC precautions if symptoms progress.  Encourage COVID testing.          Meds ordered this encounter  Medications  . cephALEXin (KEFLEX) 500 MG capsule    Sig: Take 1 capsule (500 mg total) by mouth 4 (four) times daily for 5 days.    Dispense:  20 capsule    Refill:  0  . topiramate (TOPAMAX) 50 MG tablet    Sig: Take 1 tablet (50 mg total) by mouth 2 (two) times daily.    Dispense:  180 tablet    Refill:  1  . busPIRone (BUSPAR)  10 MG tablet    Sig: Take 1 tablet (10 mg total) by mouth 2 (two) times daily.    Dispense:  180 tablet    Refill:  1  . FLUoxetine (PROZAC) 40 MG capsule    Sig: Take 1 capsule (40 mg total) by mouth daily.    Dispense:  90 capsule    Refill:  1    Follow up plan: Return in about 4 weeks (around 12/19/2019) for Anxiety follow up visit.   , FNP Family Nurse Practitioner [facility] 11/21/2019, 4:54 PM
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
## 2019-11-21 NOTE — Patient Instructions (Signed)
I have sent in a prescription for Buspar 10mg  to take 1 tablet twice per day for anxiety.  I have sent in a refill on the fluoxetine and will contact [facility] to inquire why they substituted your 40mg  tablet for 2 - 20mg  tablets.  I have sent in a prescription for cephalexin to take 1 tablet 4x per day for the next 5 days.  If you have worsening of symptoms (such as beginning to have a fever, drainage from this infection, etc) to proceed to urgent care, as it may need to be opened and drained.  The following recommendations are helpful adjuncts for helping rebalance your mood.  Eat a nourishing diet. Ensure adequate intake of calories, protein, carbs, fat, vitamins, and minerals. Prioritize whole foods at each meal, including meats, vegetables, fruits, nuts and seeds, etc.   Avoid inflammatory and/or "junk" foods, such as sugar, omega-6 fats, refined grains, chemicals, and preservatives are common in packaged and prepared foods. Minimize or completely avoid these ingredients and stick to whole foods with little to no additives. Cook from scratch as much as possible for more control over what you eat  Get enough sleep. Poor sleep is significantly associated with depression and anxiety. Make 7-9 hours of sleep nightly a top priority  Exercise appropriately. Exercise is known to improve brain functioning and boost mood. Aim for 30 minutes of daily physical activity. Avoid "overtraining," which can cause mental disturbances  Assess your light exposure. Not enough natural light during the day and too much artificial light can have a major impact on your mood. Get outside as often as possible during daylight hours. Minimize light exposure after dark and avoid the use of electronics that give off blue light before bed  Manage your stress.  Use daily stress management techniques such as meditation, yoga, or mindfulness to retrain your brain to respond differently to stress. Try deep breathing to deactivate  your "fight or flight" response.  There are many of sources with apps like Headspace, Calm or a variety of YouTube videos (videos from have guided meditation)  Prioritize your social life. Work on building social support with new friends or improve current relationships. Consider getting a pet that allows for companionship, social interaction, and physical touch. Try volunteering or joining a faith-based community to increase your sense of purpose  4-7-8 breathing technique at bedtime: breathe in to count of 4, hold breath for count of 7, exhale for count of 8; do 3-5 times for letting go of overactive thoughts  Take time to play Unstructured "play" time can help reduce anxiety and depression Options for play include music, games, sports, dance, art, etc.  Try to add daily omega 3 fatty acids, magnesium, B complex, and balanced amino acid supplements to help improve mood and anxiety.  We will plan to see you back in 4 weeks for anxiety follow up visit  You will receive a survey after today's visit either digitally by e-mail or paper by USPS mail. Your experiences and feedback matter to .  Please respond so we know how we are doing as we provide care for you.  Call with any questions/concerns/needs.  It is my goal to be available to you for your health concerns.  Thanks for choosing me to be a partner in your healthcare needs!  Romero Belling, FNP-C Family Nurse Practitioner [facility] Phone: [phone]

## 2019-11-21 NOTE — Assessment & Plan Note (Signed)
Left upper thigh with skin infection, no fluctuation on palpation, appears to be bug bite that has become infected, red, warm, tender to palpation.  Will treat with Cephalexin 500mg  4x per day for the next 4 days.  Strict ER/UC precautions.  Plan: 1. Begin cephalexin 500mg  4x per day for the next 5 days 2. If any worsening of symptoms to proceed to urgent care or the ER for evaluation 3. RTC PRN

## 2019-11-21 NOTE — Assessment & Plan Note (Signed)
PHQ9-20/GAD7-21, previously PHQ9-20/GAD7-19.  Has concerns her fluoxetine may not be working as well since [employer] has given her two 20mg  tablets to take daily instead of a 40mg  tablet.  Will contact [facility] and request this changed back to a 40mg  tablet.  Will continue buspar 20mg  BID.  Currently in therapy an following with [facility].  Plan: 1. Continue Buspar 20mg  BID 2. Continue fluoxetine 40mg  daily 3. Keep appointment with [facility] for therapy/counseling 4. RTC in 4 weeks

## 2019-11-22 ENCOUNTER — Other Ambulatory Visit: Payer: Self-pay

## 2019-11-22 ENCOUNTER — Ambulatory Visit
Admission: EM | Admit: 2019-11-22 | Discharge: 2019-11-22 | Disposition: A | Discharge status: Home / Self Care | Payer: Medicaid Other | Attending: Internal Medicine | Admitting: Internal Medicine

## 2019-11-22 DIAGNOSIS — L03116 Cellulitis of left lower limb: Secondary | ICD-10-CM | POA: Diagnosis not present

## 2019-11-22 DIAGNOSIS — R509 Fever, unspecified: Secondary | ICD-10-CM | POA: Diagnosis not present

## 2019-11-22 MED ORDER — CEFTRIAXONE SODIUM 1 G IJ SOLR
1.0000 g | Freq: Once | INTRAMUSCULAR | Status: AC
  Administered 2019-11-22: 1 g via INTRAMUSCULAR

## 2019-11-22 MED ORDER — IBUPROFEN 800 MG PO TABS: 800.0000 mg | ORAL_TABLET | Freq: Three times a day (TID) | ORAL | 0 refills | Status: AC | PRN

## 2019-11-22 MED ORDER — DOXYCYCLINE HYCLATE 100 MG PO CAPS: 100.0000 mg | ORAL_CAPSULE | Freq: Two times a day (BID) | ORAL | 0 refills | Status: AC

## 2019-11-22 MED ORDER — ONDANSETRON 4 MG PO TBDP
4.0000 mg | ORAL_TABLET | Freq: Once | ORAL | Status: AC
  Administered 2019-11-22: 4 mg via ORAL

## 2019-11-22 MED ORDER — IBUPROFEN 600 MG PO TABS
600.0000 mg | ORAL_TABLET | Freq: Once | ORAL | Status: AC
  Administered 2019-11-22: 600 mg via ORAL

## 2019-11-22 NOTE — Discharge Instructions (Signed)
CELLULITIS: Stop Keflex and start doxycycline at this time.  Take NSAIDs or Tylenol for pain as needed. You should lookout for spreading redness, fluctuance, fever, drainage from the area, or increased pain. Follow up in 2-3 days with PCP or our office sooner if condition is not improving or if especially worsening.

## 2019-11-22 NOTE — ED Triage Notes (Addendum)
Pt states some kind of insect bite to left anterior thigh on Wednesday. Area with red circle noted. States she has been having fever and chills/body aches. Seen by her PCP for same yesterday. Has had 2 doses of Cephalexin with no improvement. Endorses nausea today

## 2019-11-22 NOTE — ED Provider Notes (Addendum)
[facility]    CSN: [id number] Arrival date & time: 11/22/19  3557      History   Chief Complaint Chief Complaint  Patient presents with  . Insect Bite    HPI Sophia Berry is a 29 y.o. female presenting for redness and swelling of the left inner thigh x3 days.  She states that she saw her PCP yesterday and was prescribed Keflex she has taken 2 doses and says that that has not helped.  Patient says she thinks she might have been bit or stung by something, but she is not sure what it could have been.  She says that she has felt feverish and had body aches and chills for the past couple of days.  She says that she feels like she is getting worse despite taking the antibiotics.  She denies any known tick bites.  She denies any history of MRSA.  She has not taken anything else for symptoms.  She has no other complaints or concerns.  HPI  Past Medical History:  Diagnosis Date  . Anemia   . GERD (gastroesophageal reflux disease)    with preg.  Marland Kitchen Hyperlipidemia   . Migraines   . Shortness of breath dyspnea     Patient Active Problem List   Diagnosis Date Noted  . Skin infection 11/21/2019  . Chills 11/21/2019  . Migraine 09/24/2019  . Snoring 07/23/2019  . Encounter to establish care with new doctor 07/09/2019  . Headache 07/09/2019  . Obesity 04/29/2019  . Anxiety 04/29/2019  . Vitamin D deficiency 09/20/2018    Past Surgical History:  Procedure Laterality Date  . CESAREAN SECTION    . CESAREAN SECTION WITH BILATERAL TUBAL LIGATION N/A 05/31/2015   Procedure: REPEAT CESAREAN SECTION WITH BILATERAL TUBAL LIGATION;  Surgeon: Hildred Laser, MD;  Location: [facility] ORS;  Service: Obstetrics;  Laterality: N/A;    OB History    Gravida  3   Para  3   Term  3   Preterm      AB      Living  3     SAB      TAB      Ectopic      Multiple  0   Live Births  3        Obstetric Comments  Fetal distress.         Home Medications    Prior to  Admission medications   Medication Sig Start Date End Date Taking? Authorizing Provider  busPIRone (BUSPAR) 10 MG tablet Take 1 tablet (10 mg total) by mouth 2 (two) times daily. 11/21/19   Malfi, Jodelle Gross, FNP  cephALEXin (KEFLEX) 500 MG capsule Take 1 capsule (500 mg total) by mouth 4 (four) times daily for 5 days. 11/21/19 11/26/19  Malfi, Jodelle Gross, FNP  doxycycline (VIBRAMYCIN) 100 MG capsule Take 1 capsule (100 mg total) by mouth 2 (two) times daily for 10 days. 11/22/19 12/02/19  Shirlee Latch, PA-C  FLUoxetine (PROZAC) 40 MG capsule Take 1 capsule (40 mg total) by mouth daily. 11/21/19   Malfi, Jodelle Gross, FNP  ibuprofen (ADVIL) 800 MG tablet Take 1 tablet (800 mg total) by mouth every 8 (eight) hours as needed for up to 7 days. 11/22/19 11/29/19  Eusebio Friendly B, PA-C  topiramate (TOPAMAX) 50 MG tablet Take 1 tablet (50 mg total) by mouth 2 (two) times daily. 11/21/19 02/19/20  Malfi, Jodelle Gross, FNP  Vitamin D, Ergocalciferol, (DRISDOL) 1.25 MG (50000 UNIT)  CAPS capsule Take 1 capsule (50,000 Units total) by mouth every 7 (seven) days. Patient not taking: Reported on 11/21/2019 07/25/19   Tarri Fuller, FNP    Family History Family History  Problem Relation Age of Onset  . Asthma Mother   . Migraines Mother   . Cancer Maternal Grandmother        mat. great grandmother/unsure of what kind  . Breast cancer Maternal Aunt        Great  . Diabetes Other        mat great grandmother  . Heart failure Other        mat great grandmother    Social History Social History   Tobacco Use  . Smoking status: Never Smoker  . Smokeless tobacco: Never Used  Vaping Use  . Vaping Use: Never used  Substance Use Topics  . Alcohol use: Not Currently    Alcohol/week: 0.0 standard drinks  . Drug use: No     Allergies   Patient has no known allergies.   Review of Systems Review of Systems  Constitutional: Positive for chills, fatigue and fever. Negative for diaphoresis.  Respiratory: Negative for  cough and shortness of breath.   Cardiovascular: Negative for chest pain.  Gastrointestinal: Negative for abdominal pain, nausea and vomiting.  Musculoskeletal: Positive for myalgias. Negative for arthralgias.  Skin: Positive for color change and rash. Negative for wound.  Neurological: Negative for dizziness, weakness and headaches.  Hematological: Negative for adenopathy.     Physical Exam Triage Vital Signs ED Triage Vitals  Enc Vitals Group     BP 11/22/19 1009 121/80     Pulse Rate 11/22/19 1009 (!) 105     Resp 11/22/19 1009 20     Temp 11/22/19 1009 (!) 100.5 F (38.1 C)     Temp Source 11/22/19 1009 Oral     SpO2 11/22/19 1009 100 %     Weight 11/22/19 1011 274 lb 7.6 oz (124.5 kg)     Height 11/22/19 1011 5' 4.5" (1.638 m)     Head Circumference --      Peak Flow --      Pain Score 11/22/19 1011 10     Pain Loc --      Pain Edu? --      Excl. in GC? --    No data found.  Updated Vital Signs BP 121/80 (BP Location: Left Arm)   Pulse (!) 105   Temp (!) 100.5 F (38.1 C) (Oral)   Resp 20   Ht 5' 4.5" (1.638 m)   Wt 274 lb 7.6 oz (124.5 kg)   LMP 10/26/2019   SpO2 100%   BMI 46.39 kg/m   :     Physical Exam Vitals and nursing note reviewed.  Constitutional:      General: She is not in acute distress.    Appearance: Normal appearance. She is obese. She is not ill-appearing or toxic-appearing.  HENT:     Head: Normocephalic and atraumatic.  Eyes:     General: No scleral icterus.       Right eye: No discharge.        Left eye: No discharge.     Conjunctiva/sclera: Conjunctivae normal.  Cardiovascular:     Rate and Rhythm: Regular rhythm. Tachycardia present.     Pulses: Normal pulses.     Heart sounds: Normal heart sounds.  Pulmonary:     Effort: Pulmonary effort is normal. No respiratory distress.  Breath sounds: Normal breath sounds.  Musculoskeletal:     Cervical back: Neck supple.  Skin:    General: Skin is dry.     Comments: Of the left  upper leg: There is a large area of induration and erythema accompanied by warmth.  The entire area is tender.  There is a small black dot in the center.  No drainage.  No fluctuance.  No streaking.  Neurological:     General: No focal deficit present.     Mental Status: She is alert. Mental status is at baseline.     Motor: No weakness.     Gait: Gait normal.  Psychiatric:        Mood and Affect: Mood normal.        Behavior: Behavior normal.        Thought Content: Thought content normal.        UC Treatments / Results  Labs (all labs ordered are listed, but only abnormal results are displayed) Labs Reviewed - No data to display  EKG   Radiology No results found.  Procedures Procedures (including critical care time)  Medications Ordered in UC Medications  cefTRIAXone (ROCEPHIN) injection 1 g (has no administration in time range)  ibuprofen (ADVIL) tablet 600 mg (600 mg Oral Given 11/22/19 1016)  ondansetron (ZOFRAN-ODT) disintegrating tablet 4 mg (4 mg Oral Given 11/22/19 1017)    Initial Impression / Assessment and Plan / UC Course  I have reviewed the triage vital signs and the nursing notes.  Pertinent labs & imaging results that were available during my care of the patient were reviewed by me and considered in my medical decision making (see chart for details).   29 year old female presenting for worsening cellulitis of the left thigh.  She was prescribed Keflex yesterday says she has not any relief and has had worsening of symptoms.  Patient with a temperature of 100.5 degrees in clinic today.  Since she is admitting to fever, fatigue, body aches and chills patient given 1 g ceftriaxone.  Also advised her to stop Keflex and begin doxycycline at this time.  She states that she can follow-up with her PCP in 2 days.  Advised her to go to ER if she develops any worsening symptoms of the redness and swelling worsens.  Patient agreeable.  *Immediately before patient was  discharged she pressed on the area and had some pus drained so she wanted to have that cultured.  Wound culture taken.  Advised patient we will change antibiotic if needed when the results come in.  Final Clinical Impressions(s) / UC Diagnoses   Final diagnoses:  Cellulitis of leg, left  Fever, unspecified     Discharge Instructions     CELLULITIS: Stop Keflex and start doxycycline at this time.  Take NSAIDs or Tylenol for pain as needed. You should lookout for spreading redness, fluctuance, fever, drainage from the area, or increased pain. Follow up in 2-3 days with PCP or our office sooner if condition is not improving or if especially worsening.     ED Prescriptions    Medication Sig Dispense Auth. Provider   ibuprofen (ADVIL) 800 MG tablet Take 1 tablet (800 mg total) by mouth every 8 (eight) hours as needed for up to 7 days. 21 tablet Eusebio FriendlyEaves, Lacora Folmer B, PA-C   doxycycline (VIBRAMYCIN) 100 MG capsule Take 1 capsule (100 mg total) by mouth 2 (two) times daily for 10 days. 20 capsule Shirlee LatchEaves, Sheela Mcculley B, PA-C     PDMP not  reviewed this encounter.   Shirlee Latch, PA-C 11/22/19 1049    Eusebio Friendly B, PA-C 11/22/19 1058

## 2019-11-27 ENCOUNTER — Encounter: Payer: Self-pay | Admitting: Family Medicine

## 2019-11-27 LAB — AEROBIC/ANAEROBIC CULTURE W GRAM STAIN (SURGICAL/DEEP WOUND)

## 2019-12-05 ENCOUNTER — Encounter: Payer: Self-pay | Admitting: Licensed Clinical Social Worker

## 2019-12-05 ENCOUNTER — Other Ambulatory Visit: Payer: Self-pay

## 2019-12-05 ENCOUNTER — Ambulatory Visit (INDEPENDENT_AMBULATORY_CARE_PROVIDER_SITE_OTHER): Payer: Medicaid Other | Admitting: Licensed Clinical Social Worker

## 2019-12-05 DIAGNOSIS — F411 Generalized anxiety disorder: Secondary | ICD-10-CM

## 2019-12-05 DIAGNOSIS — O99345 Other mental disorders complicating the puerperium: Secondary | ICD-10-CM | POA: Diagnosis not present

## 2019-12-05 DIAGNOSIS — F4310 Post-traumatic stress disorder, unspecified: Secondary | ICD-10-CM | POA: Diagnosis not present

## 2019-12-05 DIAGNOSIS — F53 Postpartum depression: Secondary | ICD-10-CM | POA: Diagnosis not present

## 2019-12-05 NOTE — Progress Notes (Signed)
Patient Location: Home  Provider Location: Home Office   Virtual Visit via Video Note  I connected with Sophia Berry on 12/05/19 at  8:00 AM EDT by a video enabled telemedicine application and verified that I am speaking with the correct person using two identifiers.   I discussed the limitations of evaluation and management by telemedicine and the availability of in person appointments. The patient expressed understanding and agreed to proceed.  THERAPY PROGRESS NOTE  Session Time: 21 Minutes  Participation Level: Active  Behavioral Response: Neat and Well GroomedAlertAnxious  Type of Therapy: Individual Therapy  Treatment Goals addressed: Anxiety, Coping and Diagnosis: PTSD  Interventions: CBT  Summary: Sophia Berry is a 29 y.o. female who presents with anxiety and PTSD sxs. Pt reported she has been adjusting fairly well to her new job from home and recognizing distractions that she is attempting to minimize. Pt reported she wanted to follow up on discussion from last session and had questions about criteria for Bipolar Dx. Pt reported she is aware that this diagnosis along with schizophrenia runs in the family and worries how this might impact her own mental health and her daughter who "seems to be acting a lot like me when I was her age". Pt identified sxs experienced including disassociating. Pt reported not remembering a lot of her childhood and wondering what traumas may need to be uncovered.   Suicidal/Homicidal: No  Therapist Response: Therapist and patient reviewed dx and sxs. Therapist and patient explored thoughts, feelings and past experiences as well as how to cope and validate self. Therapist provided education around criteria for dxs while also reminding patient the point is to increase understanding of sxs and how to manage them, not identifying self as dx. Pt was receptive.  Plan: Return again in 2 weeks.  Diagnosis: Axis I: Depression, Post-Partum,  Generalized Anxiety Disorder and Post Traumatic Stress Disorder    Axis II: N/A  Loyal Gambler, LCSW, LCAS 12/05/2019

## 2019-12-19 ENCOUNTER — Encounter: Payer: Self-pay | Admitting: Licensed Clinical Social Worker

## 2019-12-19 ENCOUNTER — Ambulatory Visit (INDEPENDENT_AMBULATORY_CARE_PROVIDER_SITE_OTHER): Payer: Medicaid Other | Admitting: Licensed Clinical Social Worker

## 2019-12-19 ENCOUNTER — Other Ambulatory Visit: Payer: Self-pay

## 2019-12-19 DIAGNOSIS — F53 Postpartum depression: Secondary | ICD-10-CM | POA: Diagnosis not present

## 2019-12-19 DIAGNOSIS — F4310 Post-traumatic stress disorder, unspecified: Secondary | ICD-10-CM | POA: Diagnosis not present

## 2019-12-19 DIAGNOSIS — F411 Generalized anxiety disorder: Secondary | ICD-10-CM

## 2019-12-19 DIAGNOSIS — O99345 Other mental disorders complicating the puerperium: Secondary | ICD-10-CM | POA: Diagnosis not present

## 2019-12-19 NOTE — Progress Notes (Signed)
Virtual Visit via Video Note  I connected with Sophia Berry on 12/19/19 at  8:00 AM EDT by a video enabled telemedicine application and verified that I am speaking with the correct person using two identifiers.  Location: Patient: Home Provider: Home Office   I discussed the limitations of evaluation and management by telemedicine and the availability of in person appointments. The patient expressed understanding and agreed to proceed.  THERAPY PROGRESS NOTE  Session Time: 30 Minutes  Participation Level: Active  Behavioral Response: Casual and Well GroomedAlertAnxious  Type of Therapy: Individual Therapy  Treatment Goals addressed: Anxiety and Coping  Interventions: CBT  Summary: Sophia Berry is a 29 y.o. female who presents with anxiety and PTSD sxs. Pt reported this past week has been stressful due to concerns about her daughter's mental health. Pt's daughter made threats on video she created on school computer and a list of names that resulted in 10 day suspension from school that may become 45 days. Pt reported she took daughter to [facility] in [place] for evaluation and they will start working with her daughter to address mental health concerns with appointment upcoming next week. Pt identified sxs she is experiencing and passive SI with no intent or plan. Pt reported this often occurs when she is overwhelmed. Pt identified ways she tries to cope with "escaping" into her phone and other distractions. 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 Pt acknowledged these concerns and was receptive to feedback.   Suicidal/Homicidal: Yeswithout intent/plan  Therapist Response: Therapist and patient met for follow up session. Therapist and patient explored current stressors and  attempts to cope. Therapist provided validation of patient concerns/feelings. Therapist and patient discussed alternative coping strategies to reduce sxs. Therapist assigned patient homework assignment to review and implement mindfulness strategies as a family that she can share with daughter.  Plan: Return again in 2 weeks.  Diagnosis: Axis I: Depression, Post-Partum, Generalized Anxiety Disorder and Post Traumatic Stress Disorder    Axis II: N/A  [name], LCSW, LCAS 12/19/2019

## 2020-01-02 ENCOUNTER — Encounter: Payer: Self-pay | Admitting: Licensed Clinical Social Worker

## 2020-01-02 ENCOUNTER — Other Ambulatory Visit: Payer: Self-pay

## 2020-01-02 ENCOUNTER — Ambulatory Visit (INDEPENDENT_AMBULATORY_CARE_PROVIDER_SITE_OTHER): Payer: Medicaid Other | Admitting: Licensed Clinical Social Worker

## 2020-01-02 DIAGNOSIS — F4310 Post-traumatic stress disorder, unspecified: Secondary | ICD-10-CM | POA: Diagnosis not present

## 2020-01-02 DIAGNOSIS — O99345 Other mental disorders complicating the puerperium: Secondary | ICD-10-CM

## 2020-01-02 DIAGNOSIS — F53 Postpartum depression: Secondary | ICD-10-CM | POA: Diagnosis not present

## 2020-01-02 DIAGNOSIS — F411 Generalized anxiety disorder: Secondary | ICD-10-CM | POA: Diagnosis not present

## 2020-01-02 NOTE — Progress Notes (Signed)
Virtual Visit via Video Note  I connected with Sophia Berry on 01/02/20 at  8:00 AM EST by a video enabled telemedicine application and verified that I am speaking with the correct person using two identifiers.  Location: Patient: Home Provider: Home Office   I discussed the limitations of evaluation and management by telemedicine and the availability of in person appointments. The patient expressed understanding and agreed to proceed.  THERAPY PROGRESS NOTE  Session Time: 30 Minutes  Participation Level: Active  Behavioral Response: Well GroomedAlertAnxious  Type of Therapy: Individual Therapy  Treatment Goals addressed: Anxiety, Communication: Parenting and Coping  Interventions: CBT  Summary: Sophia Berry is a 29 y.o. female who presents with anxiety and PTSD sxs. Pt reported doing "not too bad, up and down" since last session. Pt reported that her daughter has started therapy and will be returning to school after Thanksgiving. Pt reported that daughter's behaviors are "pretty much the same" in that she selectively decides to follow instructions and does not appear to be taking consequences of her actions seriously. Pt reported that working from home is "going well, not feeling as overwhelmed about it". Pt reported that she and spouse were discussing whether they should relocate to be closer to extended family, however ultimately decided it is not the right time because "we don't want to lose the resources we have here".   Suicidal/Homicidal: No  Therapist Response: Therapist and patient met for follow up session. Therapist and patient reviewed updates regarding stressors identified last session and how patient is coping. Therapist and patient weighed pros and cons of patient potentially moving now vs planning for a move in the long term. Therapist and patient also discussed how to utilize contingency management to avoid reinforcement of daughter's undesired behaviors. Pt was  receptive.  Plan: Return again in 3 weeks.  Diagnosis: Axis I: Depression, Post-Partum, Generalized Anxiety Disorder and Post Traumatic Stress Disorder    Axis II: N/A  [name], LCSW, LCAS 01/02/2020

## 2020-01-23 ENCOUNTER — Ambulatory Visit: Payer: Medicaid Other | Admitting: Licensed Clinical Social Worker

## 2020-01-23 ENCOUNTER — Other Ambulatory Visit: Payer: Self-pay

## 2020-09-29 DIAGNOSIS — S8992XA Unspecified injury of left lower leg, initial encounter: Secondary | ICD-10-CM | POA: Diagnosis not present

## 2020-09-29 DIAGNOSIS — M25562 Pain in left knee: Secondary | ICD-10-CM | POA: Diagnosis not present

## 2020-10-04 DIAGNOSIS — M25562 Pain in left knee: Secondary | ICD-10-CM | POA: Diagnosis not present

## 2020-10-04 DIAGNOSIS — M2392 Unspecified internal derangement of left knee: Secondary | ICD-10-CM | POA: Diagnosis not present

## 2021-01-09 ENCOUNTER — Encounter: Payer: Self-pay | Admitting: Emergency Medicine

## 2021-01-09 ENCOUNTER — Other Ambulatory Visit: Payer: Self-pay

## 2021-01-09 ENCOUNTER — Emergency Department
Admission: EM | Admit: 2021-01-09 | Discharge: 2021-01-09 | Disposition: A | Discharge status: Home / Self Care | Payer: Medicaid Other | Attending: Emergency Medicine | Admitting: Emergency Medicine

## 2021-01-09 DIAGNOSIS — R000 Tachycardia, unspecified: Secondary | ICD-10-CM | POA: Insufficient documentation

## 2021-01-09 DIAGNOSIS — J45909 Unspecified asthma, uncomplicated: Secondary | ICD-10-CM | POA: Insufficient documentation

## 2021-01-09 DIAGNOSIS — H66001 Acute suppurative otitis media without spontaneous rupture of ear drum, right ear: Secondary | ICD-10-CM | POA: Diagnosis not present

## 2021-01-09 DIAGNOSIS — J101 Influenza due to other identified influenza virus with other respiratory manifestations: Secondary | ICD-10-CM | POA: Insufficient documentation

## 2021-01-09 DIAGNOSIS — Z20822 Contact with and (suspected) exposure to covid-19: Secondary | ICD-10-CM | POA: Insufficient documentation

## 2021-01-09 DIAGNOSIS — J209 Acute bronchitis, unspecified: Secondary | ICD-10-CM

## 2021-01-09 DIAGNOSIS — R059 Cough, unspecified: Secondary | ICD-10-CM | POA: Diagnosis present

## 2021-01-09 LAB — RESP PANEL BY RT-PCR (FLU A&B, COVID) ARPGX2
Influenza A by PCR: POSITIVE — AB
Influenza B by PCR: NEGATIVE
SARS Coronavirus 2 by RT PCR: NEGATIVE

## 2021-01-09 MED ORDER — ONDANSETRON 4 MG PO TBDP: ORAL_TABLET | ORAL | 0 refills | Status: AC

## 2021-01-09 MED ORDER — AMOXICILLIN-POT CLAVULANATE 875-125 MG PO TABS: 1.0000 | ORAL_TABLET | Freq: Two times a day (BID) | ORAL | 0 refills | Status: DC

## 2021-01-09 MED ORDER — ALBUTEROL SULFATE HFA 108 (90 BASE) MCG/ACT IN AERS: INHALATION_SPRAY | RESPIRATORY_TRACT | 1 refills | Status: AC

## 2021-01-09 MED ORDER — PREDNISONE 20 MG PO TABS: 60.0000 mg | ORAL_TABLET | Freq: Every day | ORAL | 0 refills | Status: AC

## 2021-01-09 NOTE — ED Provider Notes (Signed)
[facility] Emergency Department Provider Note ____________________________________________   Event Date/Time   First MD Initiated Contact with Patient 01/09/21 0840     (approximate)  I have reviewed the triage vital signs and the nursing notes.   HISTORY  Chief Complaint Cough and Otalgia    HPI Sophia Berry is a 30 y.o. female with a history of anemia migraines  Reports that in September she had "pneumonia" was treated with antibiotic with improvement.   Over the last 2 weeks her and her 3 children have all developed cough cold runny noses.  Her youngest daughter was diagnosed with influenza a week ago.  They seem to be recovering including her children, but she has continued to have a cough fairly coarse at times.  Occasionally productive.  However also has noticed that her right ear has been hurting over the last couple of days.  Think she might have a right ear infection.  She also has a history of asthma, and does use albuterol inhaler from time to time.  Not having any shortness of breath at present but reports last time she had similar symptoms she got better after treatment with a steroid inhaler and antibiotic  Slight nausea decreased appetite but otherwise feeling okay.  Feels generally fatigued has been ongoing for 2 weeks time  Past Medical History:  Diagnosis Date   Anemia    GERD (gastroesophageal reflux disease)    with preg.   Hyperlipidemia    Migraines    Shortness of breath dyspnea     Patient Active Problem List   Diagnosis Date Noted   Skin infection 11/21/2019   Chills 11/21/2019   Migraine 09/24/2019   Snoring 07/23/2019   Encounter to establish care with new doctor 07/09/2019   Headache 07/09/2019   Obesity 04/29/2019   Anxiety 04/29/2019   Vitamin D deficiency 09/20/2018    Past Surgical History:  Procedure Laterality Date   CESAREAN SECTION     CESAREAN SECTION WITH BILATERAL TUBAL LIGATION N/A 05/31/2015    Procedure: REPEAT CESAREAN SECTION WITH BILATERAL TUBAL LIGATION;  Surgeon: Hildred Laser, MD;  Location: [facility] ORS;  Service: Obstetrics;  Laterality: N/A;    Prior to Admission medications   Medication Sig Start Date End Date Taking? Authorizing Provider  albuterol (VENTOLIN HFA) 108 (90 Base) MCG/ACT inhaler Inhale 2-4 puffs by mouth every 4 hours as needed for wheezing, cough, and/or shortness of breath 01/09/21  Yes Sharyn Creamer, MD  amoxicillin-clavulanate (AUGMENTIN) 875-125 MG tablet Take 1 tablet by mouth 2 (two) times daily. 01/09/21  Yes Sharyn Creamer, MD  ondansetron (ZOFRAN ODT) 4 MG disintegrating tablet Allow 1-2 tablets to dissolve in your mouth every 8 hours as needed for nausea/vomiting 01/09/21  Yes Sharyn Creamer, MD  predniSONE (DELTASONE) 20 MG tablet Take 3 tablets (60 mg total) by mouth daily with breakfast for 5 days. 01/09/21 01/14/21 Yes Sharyn Creamer, MD  busPIRone (BUSPAR) 10 MG tablet Take 1 tablet (10 mg total) by mouth 2 (two) times daily. 11/21/19   Malfi, Jodelle Gross, FNP  FLUoxetine (PROZAC) 40 MG capsule Take 1 capsule (40 mg total) by mouth daily. 11/21/19   Malfi, Jodelle Gross, FNP  topiramate (TOPAMAX) 50 MG tablet Take 1 tablet (50 mg total) by mouth 2 (two) times daily. 11/21/19 02/19/20  Malfi, Jodelle Gross, FNP  Vitamin D, Ergocalciferol, (DRISDOL) 1.25 MG (50000 UNIT) CAPS capsule Take 1 capsule (50,000 Units total) by mouth every 7 (seven) days. Patient not taking: Reported on  11/21/2019 07/25/19   Malfi, Jodelle Gross, FNP    Allergies Patient has no known allergies.  Family History  Problem Relation Age of Onset   Asthma Mother    Migraines Mother    Cancer Maternal Grandmother        mat. great grandmother/unsure of what kind   Breast cancer Maternal Aunt        Great   Diabetes Other        mat great grandmother   Heart failure Other        mat great grandmother    Social History Social History   Tobacco Use   Smoking status: Never   Smokeless tobacco: Never   Vaping Use   Vaping Use: Never used  Substance Use Topics   Alcohol use: Not Currently    Alcohol/week: 0.0 standard drinks   Drug use: No    Review of Systems Constitutional: Some fevers off and on none today Eyes: No visual changes. ENT: No sore throat.  Right ear pain for a couple days Cardiovascular: Denies chest pain. Respiratory: Denies shortness of breath.  Some cough frequent coughing somewhat productive. Gastrointestinal: No abdominal pain.   Genitourinary: Negative for dysuria. Musculoskeletal: Negative for back pain. Skin: Negative for rash. Neurological: Negative for headaches, areas of focal weakness or numbness.    ____________________________________________   PHYSICAL EXAM:  VITAL SIGNS: ED Triage Vitals  Enc Vitals Group     BP 01/09/21 0821 (!) 103/45     Pulse Rate 01/09/21 0821 (!) 119     Resp --      Temp 01/09/21 0821 99.5 F (37.5 C)     Temp Source 01/09/21 0821 Oral     SpO2 01/09/21 0821 100 %     Weight 01/09/21 0807 275 lb (124.7 kg)     Height 01/09/21 0807 5\' 5"  (1.651 m)     Head Circumference --      Peak Flow --      Pain Score 01/09/21 0807 10     Pain Loc --      Pain Edu? --      Excl. in GC? --     Constitutional: Alert and oriented.  Mildly ill-appearing but in no distress.  Fully awake and alert.  Ambulates without difficulty a frequent dry cough is noted Eyes: Conjunctivae are normal. Head: Atraumatic. Nose: No congestion/rhinnorhea. Mouth/Throat: Mucous membranes are moist.  Left tympanic membrane is normal.  Right tympanic membrane with mild erythema and slight outward bulging  neck: No stridor.  Cardiovascular: Slightly tachycardic rate, regular rhythm. Grossly normal heart sounds.  Good peripheral circulation. Respiratory: Normal respiratory effort.  No retractions. Lungs CTAB.  Frequent cough somewhat coarse in nature. Gastrointestinal: Soft and nontender. No distention. Musculoskeletal: No lower extremity  tenderness nor edema. Neurologic:  Normal speech and language. No gross focal neurologic deficits are appreciated.  Skin:  Skin is warm, dry and intact. No rash noted. Psychiatric: Mood and affect are normal. Speech and behavior are normal.  ____________________________________________   LABS (all labs ordered are listed, but only abnormal results are displayed)  Labs Reviewed  RESP PANEL BY RT-PCR (FLU A&B, COVID) ARPGX2 - Abnormal; Notable for the following components:      Result Value   Influenza A by PCR POSITIVE (*)    All other components within normal limits   ____________________________________________  EKG   ____________________________________________  RADIOLOGY  Normal oxygen saturation, clear lung sounds are occasional coarseness.  No clear evidence support obvious  pneumonia by clinical assessment. ____________________________________________   PROCEDURES  Procedure(s) performed: None  Procedures  Critical Care performed: No  ____________________________________________   INITIAL IMPRESSION / ASSESSMENT AND PLAN / ED COURSE  Pertinent labs & imaging results that were available during my care of the patient were reviewed by me and considered in my medical decision making (see chart for details).   Patient presents for cough congestion for about 2 weeks with lingering ongoing productive cough also now having right ear pain consistent with mild right-sided otitis media.  Discussed with patient we will treat with Augmentin which may also be helpful in treatment of acute bronchitis.  Given her history of asthma and response in the past to steroids, will also provide this.  She does have an inhaler at home but we will refill it.  Discussed careful return precautions, at this point I suspect likely viral illness upper respiratory infection bronchitis now complicated by right-sided otitis media which we will treat for.  Return precautions and treatment  recommendations and follow-up discussed with the patient who is agreeable with the plan.  No clinical signs or symptoms suggest ACS, PE, dissection, pneumothorax, etc.  Patient appears appropriate for outpatient treatment antibiotic, treatment for bronchitis and careful return precautions which we discussed      ____________________________________________   FINAL CLINICAL IMPRESSION(S) / ED DIAGNOSES  Final diagnoses:  Acute bronchitis, unspecified organism  Non-recurrent acute suppurative otitis media of right ear without spontaneous rupture of tympanic membrane        Note:  This document was prepared using Dragon voice recognition software and may include unintentional dictation errors       Sharyn Creamer, MD 01/09/21 1012

## 2021-01-09 NOTE — ED Triage Notes (Signed)
Pt reports cough, congestion and ear pressure for over a week. Pt states was treated for pneumonia in September, got better but now symptoms are back

## 2021-01-16 ENCOUNTER — Emergency Department: Payer: Medicaid Other

## 2021-01-16 ENCOUNTER — Other Ambulatory Visit: Payer: Self-pay

## 2021-01-16 ENCOUNTER — Encounter: Payer: Self-pay | Admitting: Emergency Medicine

## 2021-01-16 ENCOUNTER — Emergency Department
Admission: EM | Admit: 2021-01-16 | Discharge: 2021-01-16 | Disposition: A | Discharge status: Home / Self Care | Payer: Medicaid Other | Attending: Emergency Medicine | Admitting: Emergency Medicine

## 2021-01-16 DIAGNOSIS — K1120 Sialoadenitis, unspecified: Secondary | ICD-10-CM | POA: Insufficient documentation

## 2021-01-16 DIAGNOSIS — R6884 Jaw pain: Secondary | ICD-10-CM | POA: Diagnosis present

## 2021-01-16 DIAGNOSIS — R0602 Shortness of breath: Secondary | ICD-10-CM | POA: Diagnosis not present

## 2021-01-16 DIAGNOSIS — J029 Acute pharyngitis, unspecified: Secondary | ICD-10-CM | POA: Insufficient documentation

## 2021-01-16 DIAGNOSIS — J392 Other diseases of pharynx: Secondary | ICD-10-CM | POA: Diagnosis not present

## 2021-01-16 DIAGNOSIS — R059 Cough, unspecified: Secondary | ICD-10-CM | POA: Diagnosis not present

## 2021-01-16 DIAGNOSIS — R599 Enlarged lymph nodes, unspecified: Secondary | ICD-10-CM | POA: Diagnosis not present

## 2021-01-16 DIAGNOSIS — J384 Edema of larynx: Secondary | ICD-10-CM | POA: Diagnosis not present

## 2021-01-16 DIAGNOSIS — L0211 Cutaneous abscess of neck: Secondary | ICD-10-CM | POA: Diagnosis not present

## 2021-01-16 LAB — BASIC METABOLIC PANEL
Anion gap: 6 (ref 5–15)
BUN: 21 mg/dL — ABNORMAL HIGH (ref 6–20)
CO2: 28 mmol/L (ref 22–32)
Calcium: 8.6 mg/dL — ABNORMAL LOW (ref 8.9–10.3)
Chloride: 101 mmol/L (ref 98–111)
Creatinine, Ser: 0.74 mg/dL (ref 0.44–1.00)
GFR, Estimated: >60 mL/min (ref >60)
Glucose, Bld: 101 mg/dL — ABNORMAL HIGH (ref 70–99)
Potassium: 3.5 mmol/L (ref 3.5–5.1)
Sodium: 135 mmol/L (ref 135–145)

## 2021-01-16 MED ORDER — IBUPROFEN 400 MG PO TABS
400.0000 mg | ORAL_TABLET | Freq: Once | ORAL | Status: AC
  Administered 2021-01-16: 13:00:00 400 mg via ORAL
  Filled 2021-01-16: qty 1

## 2021-01-16 MED ORDER — LIDOCAINE VISCOUS HCL 2 % MT SOLN
15.0000 mL | Freq: Once | OROMUCOSAL | Status: AC
  Administered 2021-01-16: 13:00:00 15 mL via OROMUCOSAL
  Filled 2021-01-16: qty 15

## 2021-01-16 MED ORDER — ACETAMINOPHEN 500 MG PO TABS
1000.0000 mg | ORAL_TABLET | Freq: Once | ORAL | Status: AC
  Administered 2021-01-16: 13:00:00 1000 mg via ORAL
  Filled 2021-01-16: qty 2

## 2021-01-16 MED ORDER — IOHEXOL 300 MG/ML  SOLN
75.0000 mL | Freq: Once | INTRAMUSCULAR | Status: AC | PRN
  Administered 2021-01-16: 13:00:00 75 mL via INTRAVENOUS
  Filled 2021-01-16: qty 75

## 2021-01-16 MED ORDER — CLINDAMYCIN HCL 300 MG PO CAPS: 300.0000 mg | ORAL_CAPSULE | Freq: Three times a day (TID) | ORAL | 0 refills | Status: AC

## 2021-01-16 NOTE — ED Provider Notes (Signed)
[facility] Emergency Department Provider Note  ____________________________________________   Event Date/Time   First MD Initiated Contact with Patient 01/16/21 1217     (approximate)  I have reviewed the triage vital signs and the nursing notes.   HISTORY  Chief Complaint Jaw Pain   HPI Sophia Berry is a 30 y.o. female with past medical history of anemia, GERD, HDL, migraine headaches and recent diagnosis of influenza on 11/20, completed course of prednisone and antibiotics for with concern for possible concha mitten bacterial bronchitis who presents for assessment of some right-sided upper neck pain that she states she started yesterday.  She states it is caused her to have a fairly sore throat and she feels little short of breath.  He states he still has a little bit of a cough but otherwise has not had any fevers, nausea, vomiting, chest pain, abdominal pain, diarrhea, burning with urination, back pain, rash or other acute sick symptoms.  She endorses a little bit of pressure in her ears but no other head pain.  No other acute concerns at this time.         Past Medical History:  Diagnosis Date   Anemia    GERD (gastroesophageal reflux disease)    with preg.   Hyperlipidemia    Migraines    Shortness of breath dyspnea     Patient Active Problem List   Diagnosis Date Noted   Skin infection 11/21/2019   Chills 11/21/2019   Migraine 09/24/2019   Snoring 07/23/2019   Encounter to establish care with new doctor 07/09/2019   Headache 07/09/2019   Obesity 04/29/2019   Anxiety 04/29/2019   Vitamin D deficiency 09/20/2018    Past Surgical History:  Procedure Laterality Date   CESAREAN SECTION     CESAREAN SECTION WITH BILATERAL TUBAL LIGATION N/A 05/31/2015   Procedure: REPEAT CESAREAN SECTION WITH BILATERAL TUBAL LIGATION;  Surgeon: Hildred Laser, MD;  Location: [facility] ORS;  Service: Obstetrics;  Laterality: N/A;    Prior to Admission  medications   Medication Sig Start Date End Date Taking? Authorizing Provider  clindamycin (CLEOCIN) 300 MG capsule Take 1 capsule (300 mg total) by mouth 3 (three) times daily for 5 days. 01/16/21 01/21/21 Yes Gilles Chiquito, MD  albuterol (VENTOLIN HFA) 108 (90 Base) MCG/ACT inhaler Inhale 2-4 puffs by mouth every 4 hours as needed for wheezing, cough, and/or shortness of breath 01/09/21   Sharyn Creamer, MD  busPIRone (BUSPAR) 10 MG tablet Take 1 tablet (10 mg total) by mouth 2 (two) times daily. 11/21/19   Malfi, Jodelle Gross, FNP  FLUoxetine (PROZAC) 40 MG capsule Take 1 capsule (40 mg total) by mouth daily. 11/21/19   Malfi, Jodelle Gross, FNP  ondansetron (ZOFRAN ODT) 4 MG disintegrating tablet Allow 1-2 tablets to dissolve in your mouth every 8 hours as needed for nausea/vomiting 01/09/21   Sharyn Creamer, MD  topiramate (TOPAMAX) 50 MG tablet Take 1 tablet (50 mg total) by mouth 2 (two) times daily. 11/21/19 02/19/20  Malfi, Jodelle Gross, FNP  Vitamin D, Ergocalciferol, (DRISDOL) 1.25 MG (50000 UNIT) CAPS capsule Take 1 capsule (50,000 Units total) by mouth every 7 (seven) days. Patient not taking: Reported on 11/21/2019 07/25/19   Tarri Fuller, FNP    Allergies Patient has no known allergies.  Family History  Problem Relation Age of Onset   Asthma Mother    Migraines Mother    Cancer Maternal Grandmother        mat. great  grandmother/unsure of what kind   Breast cancer Maternal Aunt        Great   Diabetes Other        mat great grandmother   Heart failure Other        mat great grandmother    Social History Social History   Tobacco Use   Smoking status: Never   Smokeless tobacco: Never  Vaping Use   Vaping Use: Never used  Substance Use Topics   Alcohol use: Not Currently    Alcohol/week: 0.0 standard drinks   Drug use: No    Review of Systems  Review of Systems  Constitutional:  Negative for chills and fever.  HENT:  Positive for sore throat.   Eyes:  Positive for pain.   Respiratory:  Positive for cough and shortness of breath. Negative for stridor.   Cardiovascular:  Negative for chest pain.  Gastrointestinal:  Negative for vomiting.  Skin:  Negative for rash.  Neurological:  Negative for seizures, loss of consciousness and headaches.  Psychiatric/Behavioral:  Negative for suicidal ideas.   All other systems reviewed and are negative.    ____________________________________________   PHYSICAL EXAM:  VITAL SIGNS: ED Triage Vitals  Enc Vitals Group     BP 01/16/21 0834 120/77     Pulse Rate 01/16/21 0834 86     Resp 01/16/21 0834 19     Temp 01/16/21 0834 97.9 F (36.6 C)     Temp Source 01/16/21 0834 Oral     SpO2 01/16/21 0834 98 %     Weight 01/16/21 0834 275 lb (124.7 kg)     Height 01/16/21 0834 5\' 5"  (1.651 m)     Head Circumference --      Peak Flow --      Pain Score 01/16/21 0845 10     Pain Loc --      Pain Edu? --      Excl. in GC? --    Vitals:   01/16/21 0834 01/16/21 1242  BP: 120/77 121/68  Pulse: 86 78  Resp: 19 20  Temp: 97.9 F (36.6 C) 97.6 F (36.4 C)  SpO2: 98% 98%   Physical Exam Vitals and nursing note reviewed.  Constitutional:      General: She is not in acute distress.    Appearance: She is well-developed.  HENT:     Head: Normocephalic and atraumatic.     Right Ear: Tympanic membrane and external ear normal.     Left Ear: Tympanic membrane and external ear normal.     Nose: Nose normal.  Eyes:     Conjunctiva/sclera: Conjunctivae normal.  Cardiovascular:     Rate and Rhythm: Normal rate and regular rhythm.     Pulses: Normal pulses.     Heart sounds: No murmur heard. Pulmonary:     Effort: Pulmonary effort is normal. No respiratory distress.     Breath sounds: Normal breath sounds.  Abdominal:     Palpations: Abdomen is soft.     Tenderness: There is no abdominal tenderness.  Musculoskeletal:        General: No swelling.     Cervical back: Neck supple.  Skin:    General: Skin is warm and  dry.     Capillary Refill: Capillary refill takes less than 2 seconds.  Neurological:     Mental Status: She is alert.  Psychiatric:        Mood and Affect: Mood normal.    Cranial nerves II through  XII are grossly intact.  There is no edema fluctuance or tenderness along the buccal or lingual aspects of the superior inferior gingiva.  There is no induration or significant tenderness under the tongue.  Posterior oropharynx is a little bit erythematous but otherwise unremarkable.  Externally patient has fairly significant area of swelling and tenderness over the right submandibular area and right superior cervical area.  Left-sided neck and submandibular area is unremarkable.  No significant overlying skin changes.  There is no stridor auscultated over the neck.  Patient is managing secretions without any difficulty.  She does not appear any respiratory distress.  Respiratory distress ____________________________________________   LABS (all labs ordered are listed, but only abnormal results are displayed)  Labs Reviewed  BASIC METABOLIC PANEL - Abnormal; Notable for the following components:      Result Value   Glucose, Bld 101 (*)    BUN 21 (*)    Calcium 8.6 (*)    All other components within normal limits   ____________________________________________  EKG  ____________________________________________  RADIOLOGY  ED MD interpretation: CT of the neck shows a right submandibular sialoadenitis.  No definitive stone is seen.  There is fairly significant surrounding edema extending towards the right parapharyngeal space to the level of thyroid gland.  No evidence of airway effacement.  There is some lymphadenopathy on the right.  There is also some sinusitis noted and small right mastoid effusion.  No other acute process noted.  No evidence of clear abscess.  Official radiology report(s): CT Soft Tissue Neck W Contrast  Result Date: 01/16/2021 CLINICAL DATA:  Neck abscess, deep  tissue,? Right neck abscess versus proctitis, jaw pain. EXAM: CT NECK WITH CONTRAST TECHNIQUE: Multidetector CT imaging of the neck was performed using the standard protocol following the bolus administration of intravenous contrast. CONTRAST:  58mL OMNIPAQUE IOHEXOL 300 MG/ML  SOLN COMPARISON:  No pertinent prior exams available for comparison. FINDINGS: Pharynx and larynx: Mild edema within the right pharynx and supraglottic larynx, which may be reactive or may reflect a component of supraglottitis. No significant airway effacement. Salivary glands: There is asymmetric prominence of the right submandibular gland with prominent surrounding edema. Findings are compatible with acute right submandibular sialoadenitis. Edema also extends to surround the adjacent right parotid gland and a component of right parotitis cannot be excluded. Edema also extends to the right parapharyngeal space and inferiorly within the right neck to the level of the thyroid gland. No obstructing sialolith is identified. The left parotid and submandibular glands are unremarkable. Thyroid: Unremarkable. Lymph nodes: Right level 2 lymph nodes are asymmetrically prominent, but measure subcentimeter in short axis, likely reactive. Vascular: The major vascular structures of the neck are patent. Limited intracranial: No evidence of acute intracranial mallet within the field of view. Visualized orbits: No acute or significant orbital finding. Mastoids and visualized paranasal sinuses: Mild mucosal thickening within the bilateral frontal sinuses. Extensive partial opacification of the bilateral ethmoid air cells. Mild mucosal thickening within the bilateral sphenoid sinuses. Moderate/severe mucosal thickening and likely fluid levels within the bilateral maxillary sinuses. Small right mastoid effusion. Skeleton: No acute bony abnormality or aggressive osseous lesion. C7-T1 spondylosis. Upper chest: No consolidation within the imaged lung apices.  IMPRESSION: Findings compatible with acute right submandibular sialoadenitis. Edema also extends to surround portions of the right parotid gland, and a component of right parotiditis cannot be excluded. Additionally, edema extends to the right parapharyngeal space and inferiorly within the right neck to the level of the thyroid gland. No  obstructing sialolith is identified. There is mild mucosal edema within the right pharynx and supraglottic larynx, which may be reactive or may reflect a component of supraglottitis. No significant airway effacement. Right level 2 lymph nodes are asymmetrically prominent, but measure subcentimeter in short axis, likely reactive. Fairly extensive paranasal sinus disease, as described. Correlate for acute sinusitis. Small right mastoid effusion. Electronically Signed   By: Jackey Loge D.O.   On: 01/16/2021 13:46    ____________________________________________   PROCEDURES  Procedure(s) performed (including Critical Care):  Procedures   ____________________________________________   INITIAL IMPRESSION / ASSESSMENT AND PLAN / ED COURSE      Patient presents with above-stated history exam for assessment of some right lower jaw swelling and pain with sensation of some shortness of breath and sore throat she feels developed yesterday in the setting of recent being diagnosed with influenza and having a arterial bronchitis.  On arrival patient is afebrile and hemodynamically stable.  Exam as above remarkable for swelling tenderness pain right-sided neck and submandibular area.  Oropharynx is otherwise relatively unremarkable.  There is no evidence of space infection under the tongue or retropharyngeal peritonsillar abscess.  No evidence of gingival abscess or tooth infection at this time.  Patient does not appear septic or meningitic.  Primary differential is concerning for possible abscess in the neck versus sialadenitis possibly complicated by infection.  BMP without  significant electrolyte metabolic derangements.  CT of the neck shows a right submandibular sialoadenitis.  No definitive stone is seen.  There is fairly significant surrounding edema extending towards the right parapharyngeal space to the level of thyroid gland.  No evidence of airway effacement.  There is some lymphadenopathy on the right.  There is also some sinusitis noted and small right mastoid effusion.  No other acute process noted.  No evidence of clear abscess.  Discussed at length with the patient my concerns for likely obstructed salivary gland duct causing backup and swelling pain and symptoms she is describing on the right side of her neck given above CT findings.  Somewhat difficult to exclude infectious component so will cover with a course of clindamycin.  Emphasized importance of sialagogues and close outpatient follow-up.  Discharged stable condition.  Strict return precautions advised and discussed.     ____________________________________________   FINAL CLINICAL IMPRESSION(S) / ED DIAGNOSES  Final diagnoses:  Sialoadenitis    Medications  lidocaine (XYLOCAINE) 2 % viscous mouth solution 15 mL (15 mLs Mouth/Throat Given 01/16/21 1244)  ibuprofen (ADVIL) tablet 400 mg (400 mg Oral Given 01/16/21 1244)  acetaminophen (TYLENOL) tablet 1,000 mg (1,000 mg Oral Given 01/16/21 1243)  iohexol (OMNIPAQUE) 300 MG/ML solution 75 mL (75 mLs Intravenous Contrast Given 01/16/21 1326)     ED Discharge Orders          Ordered    clindamycin (CLEOCIN) 300 MG capsule  3 times daily        01/16/21 1353             Note:  This document was prepared using Dragon voice recognition software and may include unintentional dictation errors.    Gilles Chiquito, MD 01/16/21 1356

## 2021-01-16 NOTE — ED Triage Notes (Signed)
Pt via POV from home. Pt c/o R lower jaw pain. Swelling noted to R lower jaw unknown if she has any dental issues on that side. Pt states that her throat is sore.

## 2021-01-16 NOTE — ED Notes (Signed)
Dc ppw provided. IV removed. Pt rx information and followup given. Pt denies any questions and provides verbal consent for DC. Pt off floor on foot

## 2021-01-16 NOTE — Discharge Instructions (Addendum)
Your CT today showed: Findings compatible with acute right submandibular sialoadenitis. Edema also extends to surround portions of the right parotid gland, and a component of right parotiditis cannot be excluded. Additionally, edema extends to the right parapharyngeal space and inferiorly within the right neck to the level of the thyroid gland. No obstructing sialolith is identified.   There is mild mucosal edema within the right pharynx and supraglottic larynx, which may be reactive or may reflect a component of supraglottitis. No significant airway effacement.   Right level 2 lymph nodes are asymmetrically prominent, but measure subcentimeter in short axis, likely reactive.   Fairly extensive paranasal sinus disease, as described. Correlate for acute sinusitis.   Small right mastoid effusion.
